# Patient Record
Sex: Male | Born: 1970 | Race: Black or African American | Hispanic: No | Marital: Single | Smoking: Never smoker
Health system: Southern US, Community
[De-identification: ages and names within clinical notes are randomized; demographics above are authoritative.]

## PROBLEM LIST (undated history)

## (undated) DIAGNOSIS — A048 Other specified bacterial intestinal infections: Secondary | ICD-10-CM

## (undated) DIAGNOSIS — K219 Gastro-esophageal reflux disease without esophagitis: Secondary | ICD-10-CM

## (undated) HISTORY — PX: NASAL SINUS SURGERY: SHX719

## (undated) HISTORY — PX: EYE SURGERY: SHX253

## (undated) HISTORY — DX: Gastro-esophageal reflux disease without esophagitis: K21.9

---

## 1999-02-22 ENCOUNTER — Emergency Department (HOSPITAL_COMMUNITY): Admission: EM | Admit: 1999-02-22 | Discharge: 1999-02-22 | Payer: Self-pay | Admitting: Emergency Medicine

## 1999-02-22 ENCOUNTER — Encounter: Payer: Self-pay | Admitting: Emergency Medicine

## 2004-03-09 ENCOUNTER — Emergency Department (HOSPITAL_COMMUNITY): Admission: EM | Admit: 2004-03-09 | Discharge: 2004-03-09 | Payer: Self-pay | Admitting: Emergency Medicine

## 2004-09-27 ENCOUNTER — Emergency Department (HOSPITAL_COMMUNITY): Admission: EM | Admit: 2004-09-27 | Discharge: 2004-09-27 | Payer: Self-pay | Admitting: Emergency Medicine

## 2005-03-18 ENCOUNTER — Emergency Department (HOSPITAL_COMMUNITY): Admission: EM | Admit: 2005-03-18 | Discharge: 2005-03-18 | Payer: Self-pay | Admitting: Emergency Medicine

## 2005-03-28 ENCOUNTER — Emergency Department (HOSPITAL_COMMUNITY): Admission: EM | Admit: 2005-03-28 | Discharge: 2005-03-28 | Payer: Self-pay | Admitting: Family Medicine

## 2005-04-14 ENCOUNTER — Emergency Department (HOSPITAL_COMMUNITY): Admission: EM | Admit: 2005-04-14 | Discharge: 2005-04-14 | Payer: Self-pay | Admitting: Emergency Medicine

## 2005-04-30 ENCOUNTER — Encounter: Admission: RE | Admit: 2005-04-30 | Discharge: 2005-04-30 | Payer: Self-pay | Admitting: Family Medicine

## 2006-12-28 ENCOUNTER — Emergency Department (HOSPITAL_COMMUNITY): Admission: EM | Admit: 2006-12-28 | Discharge: 2006-12-28 | Payer: Self-pay | Admitting: Family Medicine

## 2010-09-15 ENCOUNTER — Inpatient Hospital Stay (INDEPENDENT_AMBULATORY_CARE_PROVIDER_SITE_OTHER)
Admission: RE | Admit: 2010-09-15 | Discharge: 2010-09-15 | Disposition: A | Discharge status: Home / Self Care | Payer: BC Managed Care – PPO | Source: Ambulatory Visit | Attending: Emergency Medicine | Admitting: Emergency Medicine

## 2010-09-15 DIAGNOSIS — B081 Molluscum contagiosum: Secondary | ICD-10-CM

## 2010-09-16 ENCOUNTER — Inpatient Hospital Stay (INDEPENDENT_AMBULATORY_CARE_PROVIDER_SITE_OTHER)
Admission: RE | Admit: 2010-09-16 | Discharge: 2010-09-16 | Disposition: A | Discharge status: Home / Self Care | Payer: BC Managed Care – PPO | Source: Ambulatory Visit | Attending: Family Medicine | Admitting: Family Medicine

## 2010-09-16 DIAGNOSIS — L989 Disorder of the skin and subcutaneous tissue, unspecified: Secondary | ICD-10-CM

## 2010-09-16 LAB — RPR: RPR Ser Ql: NONREACTIVE

## 2011-01-25 ENCOUNTER — Encounter: Payer: Self-pay | Admitting: *Deleted

## 2011-01-25 ENCOUNTER — Emergency Department (INDEPENDENT_AMBULATORY_CARE_PROVIDER_SITE_OTHER)
Admission: EM | Admit: 2011-01-25 | Discharge: 2011-01-25 | Disposition: A | Discharge status: Home / Self Care | Payer: BC Managed Care – PPO | Source: Home / Self Care | Attending: Family Medicine | Admitting: Family Medicine

## 2011-01-25 DIAGNOSIS — L738 Other specified follicular disorders: Secondary | ICD-10-CM

## 2011-01-25 DIAGNOSIS — L0102 Bockhart's impetigo: Secondary | ICD-10-CM

## 2011-01-25 MED ORDER — MUPIROCIN CALCIUM 2 % EX CREA: TOPICAL_CREAM | Freq: Three times a day (TID) | CUTANEOUS | Status: AC

## 2011-01-25 NOTE — ED Notes (Signed)
Pt   Is concerned  About  A  Small  Bump  On  Penile  Shaft   He  denys  Any  Discharge  Or  Any  Burning         He  Appears  In no  Distress

## 2011-01-25 NOTE — ED Provider Notes (Signed)
History     CSN: [id number]  Arrival date & time 01/25/11  1030   First MD Initiated Contact with Patient 01/25/11 1134      Chief Complaint  Patient presents with  . Sore    (Consider location/radiation/quality/duration/timing/severity/associated sxs/prior treatment) Patient is a 41 y.o. male presenting with rash. The history is provided by the patient.  Rash  This is a new problem. The current episode started 3 to 5 hours ago (noticed small papules on penis, concerned). The problem has not changed since onset.There has been no fever. The rash is present on the genitalia and groin. The patient is experiencing no pain. Pertinent negatives include no blisters and no itching.    History reviewed. No pertinent past medical history.  History reviewed. No pertinent past surgical history.  History reviewed. No pertinent family history.  History  Substance Use Topics  . Smoking status: Not on file  . Smokeless tobacco: Not on file  . Alcohol Use: Not on file      Review of Systems  Genitourinary: Negative.   Skin: Positive for rash. Negative for itching.    Allergies  Review of patient's allergies indicates no known allergies.  Home Medications   Current Outpatient Rx  Name Route Sig Dispense Refill  . MUPIROCIN CALCIUM 2 % EX CREA Topical Apply topically 3 (three) times daily. 15 g 0    BP 155/94  Pulse 62  Temp(Src) 98.3 F (36.8 C) (Oral)  Resp 16  SpO2 100%  Physical Exam  Nursing note and vitals reviewed. Constitutional: He appears well-developed and well-nourished.  Genitourinary:    No penile tenderness.    ED Course  Procedures (including critical care time)  Labs Reviewed - No data to display No results found.   1. Pustular folliculitis       MDM          Barkley Bruns, MD 01/29/11 [phone]

## 2011-02-08 ENCOUNTER — Other Ambulatory Visit: Payer: Self-pay | Admitting: Family Medicine

## 2011-02-08 ENCOUNTER — Ambulatory Visit
Admission: RE | Admit: 2011-02-08 | Discharge: 2011-02-08 | Disposition: A | Discharge status: Home / Self Care | Payer: BC Managed Care – PPO | Source: Ambulatory Visit | Attending: Family Medicine | Admitting: Family Medicine

## 2011-02-08 DIAGNOSIS — R52 Pain, unspecified: Secondary | ICD-10-CM

## 2012-10-09 ENCOUNTER — Other Ambulatory Visit: Payer: Self-pay | Admitting: Family Medicine

## 2012-10-09 DIAGNOSIS — G8929 Other chronic pain: Secondary | ICD-10-CM

## 2012-10-12 ENCOUNTER — Ambulatory Visit: Payer: BC Managed Care – PPO | Attending: Family Medicine

## 2013-02-14 ENCOUNTER — Emergency Department (HOSPITAL_COMMUNITY)
Admission: EM | Admit: 2013-02-14 | Discharge: 2013-02-14 | Disposition: A | Discharge status: Home / Self Care | Payer: BC Managed Care – PPO | Attending: Emergency Medicine | Admitting: Emergency Medicine

## 2013-02-14 ENCOUNTER — Encounter (HOSPITAL_COMMUNITY): Payer: Self-pay | Admitting: Emergency Medicine

## 2013-02-14 DIAGNOSIS — J329 Chronic sinusitis, unspecified: Secondary | ICD-10-CM | POA: Insufficient documentation

## 2013-02-14 DIAGNOSIS — R110 Nausea: Secondary | ICD-10-CM | POA: Insufficient documentation

## 2013-02-14 MED ORDER — PSEUDOEPHEDRINE HCL 30 MG PO TABS: 30.0000 mg | ORAL_TABLET | ORAL | Status: DC | PRN

## 2013-02-14 MED ORDER — AMOXICILLIN-POT CLAVULANATE 875-125 MG PO TABS: 1.0000 | ORAL_TABLET | Freq: Two times a day (BID) | ORAL | Status: DC

## 2013-02-14 NOTE — ED Notes (Signed)
Patient states his symptoms have been going on since December. He has been taking zyrtec and saline spray for his head congestion. Drainage from nose is clear but he states he has been coughing up green sputum.

## 2013-02-14 NOTE — ED Notes (Signed)
Presents with pain behind eyes began during the summer time, told he had a sinus infection and took amoxicillin with no relief. Since has been experiencing pressure and pain around eye sockets and nose. Also reports dizziness with movement, dizziness is better when still. When my nose in not draining, it hurts and there is pressure. When it is draining I am good and I feel great, when I lean forward I feel dizzy.

## 2013-02-14 NOTE — ED Provider Notes (Signed)
CSN: [id number]     Arrival date & time 02/14/13  1450 History  This chart was scribed for non-physician practitioner Ignacia Felling, PA-C, working with Threasa Beards, MD, by Neta Ehlers, ED Scribe. This patient was seen in room TR09C/TR09C and the patient's care was started at 3:19 PM.   First MD Initiated Contact with Patient 02/14/13 1519     Chief Complaint  Patient presents with  . Eye Pain   Patient is a 43 y.o. male presenting with eye pain. The history is provided by the patient. No language interpreter was used.  Eye Pain    HPI Comments: Richardson Dubree is a 43 y.o. male who presents to the Emergency Department complaining of bilateral eye pain which has persisted for approximately a month. The pt reports he has also experienced facial pressure, rhinorrhea, a cough productive of green phlegm, an intermittent sore throat, and nausea when leaning forward. The pt reports the cough occurs primarily in the morning. He denies chills and a fever. Previously, he was treated for similar symptoms with amoxicillin without relief. The pt is a non-smoker.   History reviewed. No pertinent past medical history. History reviewed. No pertinent past surgical history. History reviewed. No pertinent family history. History  Substance Use Topics  . Smoking status: Never Smoker   . Smokeless tobacco: Not on file  . Alcohol Use: No    Review of Systems  Constitutional: Negative for fever and chills.  HENT: Positive for rhinorrhea, sinus pressure and sore throat.   Eyes: Positive for pain.  Respiratory: Positive for cough.   Gastrointestinal: Positive for nausea (Positional).  All other systems reviewed and are negative.   Allergies  Review of patient's allergies indicates no known allergies.  Home Medications  No current outpatient prescriptions on file.  Triage Vitals: BP 150/85  Pulse 71  Temp(Src) 97.9 F (36.6 C) (Oral)  Resp 18  Ht 5\' 10"  (1.778 m)  Wt 193 lb (87.544 kg)   BMI 27.69 kg/m2  SpO2 99%  Physical Exam  Nursing note and vitals reviewed. Constitutional: He is oriented to person, place, and time. He appears well-developed and well-nourished. No distress.  HENT:  Head: Normocephalic and atraumatic.  Left Ear: External ear normal.  Mouth/Throat: Oropharynx is clear and moist. No oropharyngeal exudate.  Boggy nasal mucosa, frontal and maxillary sinus ttp   Eyes: Conjunctivae and EOM are normal. Pupils are equal, round, and reactive to light. No scleral icterus.  Neck: Normal range of motion. Neck supple. No tracheal deviation present.  Cardiovascular: Normal rate, regular rhythm and normal heart sounds.  Exam reveals no gallop and no friction rub.   No murmur heard. Pulmonary/Chest: Effort normal. No respiratory distress. He has no wheezes. He has no rales. He exhibits no tenderness.  Abdominal: Soft. Bowel sounds are normal. He exhibits no distension. There is no tenderness.  Musculoskeletal: Normal range of motion.  Lymphadenopathy:    He has no cervical adenopathy.  Neurological: He is alert and oriented to person, place, and time. He exhibits normal muscle tone. Coordination normal.  Skin: Skin is warm and dry.  Psychiatric: He has a normal mood and affect. His behavior is normal.    ED Course  Procedures (including critical care time)  DIAGNOSTIC STUDIES: Oxygen Saturation is 99% on room air, normal by my interpretation.    COORDINATION OF CARE:  3:25 PM- Discussed treatment plan with patient, which includes the antibiotic Augmentin and a nasal decongestion, and the patient agreed to the plan.  Also encouraged the pt to try a Nettie pot.   Labs Review Labs Reviewed - No data to display Imaging Review No results found.  EKG Interpretation   None       MDM  Sinus infection  Patient here with complaints of headache, sinus pain and pressure since the summer.  There is no fever, chills, reports occasional nausea but no vomiting.   Will place on antibiotics and nasal decongestants.  I personally performed the services described in this documentation, which was scribed in my presence. The recorded information has been reviewed and is accurate.      Idalia Needle Joelyn Oms, [place] 02/14/13 1975

## 2013-02-14 NOTE — Discharge Instructions (Signed)

## 2013-02-14 NOTE — ED Provider Notes (Signed)
Medical screening examination/treatment/procedure(s) were performed by non-physician practitioner and as supervising physician I was immediately available for consultation/collaboration.  EKG Interpretation   None        Threasa Beards, MD 02/14/13 [phone]

## 2013-03-11 ENCOUNTER — Emergency Department (HOSPITAL_COMMUNITY): Payer: BC Managed Care – PPO

## 2013-03-11 ENCOUNTER — Encounter (HOSPITAL_COMMUNITY): Payer: Self-pay | Admitting: Emergency Medicine

## 2013-03-11 ENCOUNTER — Emergency Department (HOSPITAL_COMMUNITY)
Admission: EM | Admit: 2013-03-11 | Discharge: 2013-03-11 | Disposition: A | Discharge status: Home / Self Care | Payer: BC Managed Care – PPO | Attending: Emergency Medicine | Admitting: Emergency Medicine

## 2013-03-11 DIAGNOSIS — R05 Cough: Secondary | ICD-10-CM

## 2013-03-11 DIAGNOSIS — J3489 Other specified disorders of nose and nasal sinuses: Secondary | ICD-10-CM | POA: Insufficient documentation

## 2013-03-11 DIAGNOSIS — R059 Cough, unspecified: Secondary | ICD-10-CM | POA: Insufficient documentation

## 2013-03-11 DIAGNOSIS — R6883 Chills (without fever): Secondary | ICD-10-CM | POA: Insufficient documentation

## 2013-03-11 MED ORDER — GUAIFENESIN 100 MG/5ML PO LIQD: 100.0000 mg | ORAL | Status: DC | PRN

## 2013-03-11 NOTE — ED Notes (Addendum)
Pt c/o coughing up blood 2 weeks ago after having a root canal and dx with sinus infection.  Pt c/o pressure type pain in face.

## 2013-03-11 NOTE — Discharge Instructions (Signed)

## 2013-03-11 NOTE — ED Provider Notes (Signed)
CSN: [id number]     Arrival date & time 03/11/13  1711 History  This chart was scribed for non-physician practitioner Montine Circle, PA-C working with Saddie Benders. Dorna Mai, MD by Eston Mould, ED Scribe. This patient was seen in room TR06C/TR06C and the patient's care was started at 5:34 PM .   Chief Complaint  Patient presents with  . Hemoptysis   The history is provided by the patient. No language interpreter was used.   HPI Comments: Preston Manning is a 43 y.o. male who presents to the Emergency Department complaining of hemoptysis with associated chills and night sweats that began recently . Pt states he was diagnosed with chronic sinusitis by [facility]. He reports having surgery in 2 weeks for sinuses. Pt states he recently had a root canal to L upper 1st molar. Additionally, he states he has recently started coughing dark brown to red sputum. Pt states he feels SOB when working out but states this is unchanged from normal. Pt denies fever, CP, and SOB.  History reviewed. No pertinent past medical history. History reviewed. No pertinent past surgical history. No family history on file. History  Substance Use Topics  . Smoking status: Never Smoker   . Smokeless tobacco: Not on file  . Alcohol Use: No    Review of Systems  Constitutional: Positive for chills (& night sweats). Negative for fever.  HENT: Positive for congestion.   Respiratory: Positive for cough (with sputum). Negative for chest tightness and shortness of breath.   Cardiovascular: Negative for chest pain.    Allergies  Review of patient's allergies indicates no known allergies.  Home Medications   Current Outpatient Rx  Name  Route  Sig  Dispense  Refill  . amoxicillin-clavulanate (AUGMENTIN) 875-125 MG per tablet   Oral   Take 1 tablet by mouth 2 (two) times daily.   28 tablet   0   . Ascorbic Acid (VITAMIN C PO)   Oral   Take 1 tablet by mouth daily.         Marland Kitchen glucosamine-chondroitin 500-400 MG  tablet   Oral   Take 1 tablet by mouth 3 (three) times daily.         . pseudoephedrine (SUDAFED) 30 MG tablet   Oral   Take 1 tablet (30 mg total) by mouth every 4 (four) hours as needed for congestion.   30 tablet   0    BP 163/95  Pulse 76  Temp(Src) 97.4 F (36.3 C) (Oral)  Resp 16  SpO2 100%  Physical Exam  Nursing note and vitals reviewed. Constitutional: He is oriented to person, place, and time. He appears well-developed and well-nourished. No distress.  HENT:  Head: Normocephalic and atraumatic.  Oropharynx clear, no evidence of tonsillar, peritonsillar or gingival abscess, uvula is midline. Airway is intact.  Frontal and maxillary sinuses are tender to palpitation.  No evidence of preseptal or perioribtal cellulitis.  Eyes: EOM are normal.  Neck: Neck supple. No tracheal deviation present.  Cardiovascular: Normal rate, regular rhythm and normal heart sounds.  Exam reveals no gallop and no friction rub.   No murmur heard. Pulmonary/Chest: Effort normal and breath sounds normal. No respiratory distress. He has no wheezes. He has no rales. He exhibits no tenderness.  Musculoskeletal: Normal range of motion.  Lymphadenopathy:    He has no cervical adenopathy.  Neurological: He is alert and oriented to person, place, and time.  Skin: Skin is warm and dry.  Psychiatric: He has a normal mood  and affect. His behavior is normal.    ED Course  Procedures DIAGNOSTIC STUDIES: Oxygen Saturation is 100% on RA, normal by my interpretation.    COORDINATION OF CARE: 5:38 PM-Discussed treatment plan which includes CXR to rule out pneumonia. Pt agreed to plan.   Labs Review Labs Reviewed - No data to display Imaging Review Dg Chest 2 View  03/11/2013   CLINICAL DATA:  Hemoptysis, chest pain.  EXAM: CHEST  2 VIEW  COMPARISON:  February 08, 2011.  FINDINGS: The heart size and mediastinal contours are within normal limits. Both lungs are clear. No pleural effusion or  pneumothorax is noted. The visualized skeletal structures are unremarkable.  IMPRESSION: No acute cardiopulmonary abnormality seen.   Electronically Signed   By: Sabino Dick M.D.   On: 03/11/2013 18:14    EKG Interpretation   None      MDM   Final diagnoses:  Cough    Patient's main complaint is productive cough. He states this started recently. He denies any chest pain, or associated shortness of breath. Doubt PE, patient is not tachycardic, nor does he have any desaturation in his oxygenation. I will check a chest x-ray, which if negative, I will ambulate the patient.  As long as he maintains sufficient oxygenation, believe that he can be discharged home. Strict return precautions are given.  Chest x-ray is negative. Discharged to home with symptomatic treatment. Patient has good followup. He has not hypoxic nor tachycardic, nor does he have any chest pain.  Is as discharging the patient, he mentioned that his upper traps have been stiff, and he thinks that it is giving of headache. I suspect this likely tension headache. Has no meningeal signs, no neck stiffness, no fever. He looks well. 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 I suspect this is muscle tension, and will recommend ibuprofen and ice.  Discussed patient with Dr. Dorna Mai, who agrees with plan.  I personally performed the services described in this documentation, which was scribed in my presence. The recorded information has been reviewed and is accurate.     Montine Circle, PA-C 03/11/13 [phone]

## 2013-03-19 NOTE — ED Provider Notes (Signed)
Medical screening examination/treatment/procedure(s) were performed by non-physician practitioner and as supervising physician I was immediately available for consultation/collaboration.  EKG Interpretation  None  .  Saddie Benders. Miasia Crabtree, MD 03/19/13 1032

## 2013-03-24 ENCOUNTER — Other Ambulatory Visit: Payer: Self-pay | Admitting: Otolaryngology

## 2013-05-29 ENCOUNTER — Encounter (HOSPITAL_COMMUNITY): Payer: Self-pay | Admitting: Emergency Medicine

## 2013-05-29 ENCOUNTER — Emergency Department (HOSPITAL_COMMUNITY)
Admission: EM | Admit: 2013-05-29 | Discharge: 2013-05-29 | Disposition: A | Discharge status: Home / Self Care | Payer: BC Managed Care – PPO | Source: Home / Self Care

## 2013-05-29 DIAGNOSIS — K219 Gastro-esophageal reflux disease without esophagitis: Secondary | ICD-10-CM

## 2013-05-29 MED ORDER — OMEPRAZOLE 20 MG PO CPDR: 20.0000 mg | DELAYED_RELEASE_CAPSULE | Freq: Two times a day (BID) | ORAL | Status: DC

## 2013-05-29 NOTE — ED Provider Notes (Signed)
  Chief Complaint   Chief Complaint  Patient presents with  . Gastrophageal Reflux    History of Present Illness   Preston Manning is a 43 year old male who went on a cruise a couple weeks ago and over it. He gained about 10 pounds then lost 6, so is about 4 pounds up. Ever since then he's had increase in symptoms of reflux with indigestion, heartburn, abdomen feels bloated, and he has a sensation of a lump in the throat, intermittent hoarseness. He denies any sore throat or dysphagia. He's had no nausea, vomiting, or evidence of GI bleeding. He has had gastroesophageal reflux in the past but none recently. He's also had mildly elevated liver function tests and he was positive for H. pylori in 2006. He took a course of antibiotics for this. He has no history of ulcers or GI bleeding.  Review of Systems   Other than as noted above, the patient denies any of the following symptoms: Constitutional:  No fever, chills, weight loss or anorexia. Abdomen:  No nausea, vomiting, hematememesis, melena, diarrhea, or hematochezia. GU:  No dysuria, frequency, urgency, or hematuria.  No testicular pain or swelling.  [place]   Past medical history, family history, social history, meds, and allergies were reviewed.   Physical Examination     Vital signs:  BP 138/90  Pulse 65  Temp(Src) 98.5 F (36.9 C) (Oral)  Resp 18  SpO2 100% Gen:  Alert, oriented, in no distress. Lungs:  Breath sounds clear and equal bilaterally.  No wheezes, rales or rhonchi. Heart:  Regular rhythm.  No gallops or murmers.   Abdomen:  Mild epigastric tenderness to palpation. No guarding or rebound. No organomegaly or mass. Bowel sounds are normally active. Skin:  Clear, warm and dry.  No rash.  Assessment   The encounter diagnosis was GERD (gastroesophageal reflux disease).  Plan     1.  Meds:  The following meds were prescribed:   Discharge Medication List as of 05/29/2013  1:52 PM    START taking these medications   Details  omeprazole (PRILOSEC) 20 MG capsule Take 1 capsule (20 mg total) by mouth 2 (two) times daily before a meal., Starting 05/29/2013, Until Discontinued, Normal        2.  Patient Education/Counseling:  The patient was given appropriate handouts, self care instructions, and instructed in symptomatic relief.  Patient was placed on antireflux diet. If no improvement will need to followup with gastroenterology.  3.  Follow up:  The patient was told to follow up here if no better in 2 to 3 days, or sooner if becoming worse in any way, and given some red flag symptoms such as worsening pain, persistent vomiting, fever, or evidence of GI bleeding which would prompt immediate return.  Follow up here if needed.      Harden Mo, MD 05/29/13 [phone]

## 2013-05-29 NOTE — Discharge Instructions (Signed)

## 2013-05-29 NOTE — ED Notes (Signed)
Pt  Reports  Symptoms  Of  Bloating   And  Abdominal  Pressure         With    Reflux    Symptoms  For  sev  Days   No  Nausea  No  Vomiting    Symptoms  Not  releived    By  meds

## 2013-06-01 ENCOUNTER — Encounter: Payer: Self-pay | Admitting: Internal Medicine

## 2013-06-09 ENCOUNTER — Other Ambulatory Visit: Payer: Self-pay | Admitting: Family Medicine

## 2013-06-09 DIAGNOSIS — R7989 Other specified abnormal findings of blood chemistry: Secondary | ICD-10-CM

## 2013-06-09 DIAGNOSIS — R945 Abnormal results of liver function studies: Principal | ICD-10-CM

## 2013-06-11 ENCOUNTER — Ambulatory Visit
Admission: RE | Admit: 2013-06-11 | Discharge: 2013-06-11 | Disposition: A | Discharge status: Home / Self Care | Payer: BC Managed Care – PPO | Source: Ambulatory Visit | Attending: Family Medicine | Admitting: Family Medicine

## 2013-06-11 DIAGNOSIS — R7989 Other specified abnormal findings of blood chemistry: Secondary | ICD-10-CM

## 2013-06-11 DIAGNOSIS — R945 Abnormal results of liver function studies: Principal | ICD-10-CM

## 2013-07-22 ENCOUNTER — Encounter (HOSPITAL_COMMUNITY): Payer: Self-pay | Admitting: Emergency Medicine

## 2013-07-22 ENCOUNTER — Encounter: Payer: Self-pay | Admitting: Internal Medicine

## 2013-07-22 ENCOUNTER — Emergency Department (INDEPENDENT_AMBULATORY_CARE_PROVIDER_SITE_OTHER)
Admission: EM | Admit: 2013-07-22 | Discharge: 2013-07-22 | Disposition: A | Discharge status: Home / Self Care | Payer: BC Managed Care – PPO | Source: Home / Self Care

## 2013-07-22 DIAGNOSIS — K148 Other diseases of tongue: Secondary | ICD-10-CM

## 2013-07-22 HISTORY — DX: Other specified bacterial intestinal infections: A04.8

## 2013-07-22 MED ORDER — NYSTATIN 100000 UNIT/ML MT SUSP: 500000.0000 [IU] | Freq: Four times a day (QID) | OROMUCOSAL | Status: DC

## 2013-07-22 NOTE — Discharge Instructions (Signed)
Thank you for coming in today. Thrush, Adult  Ritta Slot, also called oral candidiasis, is a fungal infection that develops in the mouth and throat and on the tongue. It causes white patches to form on the mouth and tongue. Ritta Slot is most common in older adults, but it can occur at any age.  Many cases of thrush are mild, but this infection can also be more serious. Ritta Slot can be a recurring problem for people who have chronic illnesses or who take medicines that limit the body's ability to fight infection. Because these people have difficulty fighting infections, the fungus that causes thrush can spread throughout the body. This can cause life-threatening blood or organ infections. CAUSES  Ritta Slot is usually caused by a yeast called Candida albicans. This fungus is normally present in small amounts in the mouth and on other mucous membranes. It usually causes no harm. However, when conditions are present that allow the fungus to grow uncontrolled, it invades surrounding tissues and becomes an infection. Less often, other Candida species can also lead to thrush.  RISK FACTORS Ritta Slot is more likely to develop in the following people:  People with an impaired ability to fight infection (weakened immune system).   Older adults.   People with HIV.   People with diabetes.   People with dry mouth (xerostomia).   Pregnant women.   People with poor dental care, especially those who have false teeth.   People who use antibiotic medicines.  SIGNS AND SYMPTOMS  Ritta Slot can be a mild infection that causes no symptoms. If symptoms develop, they may include:   A burning feeling in the mouth and throat. This can occur at the start of a thrush infection.   White patches that adhere to the mouth and tongue. The tissue around the patches may be red, raw, and painful. If rubbed (during tooth brushing, for example), the patches and the tissue of the mouth may bleed easily.   A bad taste in the mouth  or difficulty tasting foods.   Cottony feeling in the mouth.   Pain during eating and swallowing. DIAGNOSIS  Your health care provider can usually diagnose thrush by looking in your mouth and asking you questions about your health.  TREATMENT  Medicines that help prevent the growth of fungi (antifungals) are the standard treatment for thrush. These medicines are either applied directly to the affected area (topical) or swallowed (oral). The treatment will depend on the severity of the condition.  Mild Thrush Mild cases of thrush may clear up with the use of an antifungal mouth rinse or lozenges. Treatment usually lasts about 14 days.  Moderate to Severe Thrush  More severe thrush infections that have spread to the esophagus are treated with an oral antifungal medicine. A topical antifungal medicine may also be used.   For some severe infections, a treatment period longer than 14 days may be needed.   Oral antifungal medicines are almost never used during pregnancy because the fetus may be harmed. However, if a pregnant woman has a rare, severe thrush infection that has spread to her blood, oral antifungal medicines may be used. In this case, the risk of harm to the mother and fetus from the severe thrush infection may be greater than the risk posed by the use of antifungal medicines.  Persistent or Recurrent Thrush For cases of thrush that do not go away or keep coming back, treatment may involve the following:   Treatment may be needed twice as long as the  symptoms last.   Treatment will include both oral and topical antifungal medicines.   People with weakened immune systems can take an antifungal medicine on a continuous basis to prevent thrush infections.  It is important to treat conditions that make you more likely to get thrush, such as diabetes or HIV.  HOME CARE INSTRUCTIONS   Only take over-the-counter or prescription medicine as directed by your health care provider.  Talk to your health care provider about an over-the-counter medicine called gentian violet, which kills bacteria and fungi.   Eat plain, unflavored yogurt as directed by your health care provider. Check the label to make sure the yogurt contains live cultures. This yogurt can help healthy bacteria grow in the mouth that can stop the growth of the fungus that causes thrush.   Try these measures to help reduce the discomfort of thrush:   Drink cold liquids such as water or iced tea.   Try flavored ice treats or frozen juices.   Eat foods that are easy to swallow, such as gelatin, ice cream, or custard.   If the patches in your mouth are painful, try drinking from a straw.   Rinse your mouth several times a day with a warm saltwater rinse. You can make the saltwater mixture with 1 tsp (6 g) of salt in 8 fl oz (0.2 L) of warm water.   If you wear dentures, remove the dentures before going to bed, brush them vigorously, and soak them in a cleaning solution as directed by your health care provider.   Women who are breastfeeding should clean their nipples with an antifungal medicine as directed by their health care provider. Dry the nipples after breastfeeding. Applying lanolin-containing body lotion may help relieve nipple soreness.  SEEK MEDICAL CARE IF:  Your symptoms are getting worse or are not improving within 7 days of starting treatment.   You have symptoms of spreading infection, such as white patches on the skin outside of the mouth.   You are nursing and you have redness, burning, or pain in the nipples that is not relieved with treatment.  MAKE SURE YOU:  Understand these instructions.  Will watch your condition.  Will get help right away if you are not doing well or get worse. Document Released: 10/03/2003 Document Revised: 10/28/2012 Document Reviewed: 08/10/2012 [facility] Patient Information 2015 [place], [place]. This information is not intended to replace advice  given to you by your health care provider. Make sure you discuss any questions you have with your health care provider.

## 2013-07-22 NOTE — ED Notes (Signed)
C/o white tongue.  Noticed may 19.  Patient has been seen and treated for hpylori, reports last dose taken 2 weeks ago

## 2013-07-22 NOTE — ED Provider Notes (Signed)
Astor Gentle is a 43 y.o. male who presents to Urgent Care today for discolored tongue. Patient is white discoloration to his tongue. This occurred for several weeks. He noticed that after treatment for H. pylori by his primary care Dr. he denies any significant irritation fevers or chills nausea vomiting or diarrhea. No trouble swallowing or speaking.   Past Medical History  Diagnosis Date  . Helicobacter pylori (H. pylori)    History  Substance Use Topics  . Smoking status: Never Smoker   . Smokeless tobacco: Not on file  . Alcohol Use: No   ROS as above Medications: No current facility-administered medications for this encounter.   Current Outpatient Prescriptions  Medication Sig Dispense Refill  . Ascorbic Acid (VITAMIN C PO) Take 1 tablet by mouth 3 (three) times daily.       Marland Kitchen GARLIC PO Take 1 capsule by mouth 3 (three) times daily.      Marland Kitchen glucosamine-chondroitin 500-400 MG tablet Take 1 tablet by mouth 2 (two) times daily.       Marland Kitchen ibuprofen (ADVIL,MOTRIN) 200 MG tablet Take 400 mg by mouth 3 (three) times daily as needed (pain).      . nystatin (MYCOSTATIN) 100000 UNIT/ML suspension Take 5 mLs (500,000 Units total) by mouth 4 (four) times daily. Swish and swallow x 1 week  240 mL  1  . omeprazole (PRILOSEC) 20 MG capsule Take 1 capsule (20 mg total) by mouth 2 (two) times daily before a meal.  60 capsule  1  . OREGANO PO Take 1 capsule by mouth daily.      . pseudoephedrine (SUDAFED) 30 MG tablet Take 30 mg by mouth daily as needed for congestion.      . TURMERIC PO Take 1 capsule by mouth daily.        Exam:  BP 138/85  Pulse 69  Temp(Src) 98 F (36.7 C) (Oral)  Resp 16  SpO2 100% Gen: Well NAD HEENT: EOMI,  MMM mild whitish discoloration of the tongue. No other oral lesions. Lungs: Normal work of breathing. CTABL Heart: RRR no MRG Abd: NABS, Soft. NT, ND Exts: Brisk capillary refill, warm and well perfused.   No results found for this or any previous visit (from  the past 24 hour(s)). No results found.  Assessment and Plan: 43 y.o. male with possible oral thrush. Plan to treat with nystatin  Discussed warning signs or symptoms. Please see discharge instructions. Patient expresses understanding.    Gregor Hams, MD 07/22/13 1426

## 2013-07-29 ENCOUNTER — Ambulatory Visit (INDEPENDENT_AMBULATORY_CARE_PROVIDER_SITE_OTHER): Payer: BC Managed Care – PPO | Admitting: Internal Medicine

## 2013-07-29 ENCOUNTER — Encounter: Payer: Self-pay | Admitting: Internal Medicine

## 2013-07-29 ENCOUNTER — Other Ambulatory Visit (INDEPENDENT_AMBULATORY_CARE_PROVIDER_SITE_OTHER): Payer: BC Managed Care – PPO

## 2013-07-29 VITALS — BP 138/80 | HR 64 | Ht 70.0 in | Wt 192.0 lb

## 2013-07-29 DIAGNOSIS — Z8619 Personal history of other infectious and parasitic diseases: Secondary | ICD-10-CM

## 2013-07-29 DIAGNOSIS — R140 Abdominal distension (gaseous): Secondary | ICD-10-CM

## 2013-07-29 DIAGNOSIS — R1010 Upper abdominal pain, unspecified: Secondary | ICD-10-CM

## 2013-07-29 DIAGNOSIS — R1013 Epigastric pain: Secondary | ICD-10-CM

## 2013-07-29 DIAGNOSIS — R143 Flatulence: Secondary | ICD-10-CM

## 2013-07-29 DIAGNOSIS — K219 Gastro-esophageal reflux disease without esophagitis: Secondary | ICD-10-CM

## 2013-07-29 DIAGNOSIS — R142 Eructation: Secondary | ICD-10-CM

## 2013-07-29 DIAGNOSIS — Z800 Family history of malignant neoplasm of digestive organs: Secondary | ICD-10-CM

## 2013-07-29 DIAGNOSIS — K3189 Other diseases of stomach and duodenum: Secondary | ICD-10-CM

## 2013-07-29 DIAGNOSIS — R109 Unspecified abdominal pain: Secondary | ICD-10-CM

## 2013-07-29 DIAGNOSIS — R141 Gas pain: Secondary | ICD-10-CM

## 2013-07-29 LAB — CBC
HCT: 42.1 % (ref 39.0–52.0)
Hemoglobin: 13.8 g/dL (ref 13.0–17.0)
MCHC: 32.8 g/dL (ref 30.0–36.0)
MCV: 76.3 fl — AB (ref 78.0–100.0)
Platelets: 195 10*3/uL (ref 150.0–400.0)
RBC: 5.52 Mil/uL (ref 4.22–5.81)
RDW: 15 % (ref 11.5–15.5)
WBC: 3.7 10*3/uL — ABNORMAL LOW (ref 4.0–10.5)

## 2013-07-29 LAB — COMPREHENSIVE METABOLIC PANEL
ALBUMIN: 4 g/dL (ref 3.5–5.2)
ALK PHOS: 67 U/L (ref 39–117)
ALT: 69 U/L — ABNORMAL HIGH (ref 0–53)
AST: 71 U/L — AB (ref 0–37)
BILIRUBIN TOTAL: 0.6 mg/dL (ref 0.2–1.2)
BUN: 11 mg/dL (ref 6–23)
CO2: 29 mEq/L (ref 19–32)
Calcium: 9.6 mg/dL (ref 8.4–10.5)
Chloride: 101 mEq/L (ref 96–112)
Creatinine, Ser: 0.9 mg/dL (ref 0.4–1.5)
GFR: 123.52 mL/min (ref >60.00)
Glucose, Bld: 105 mg/dL — ABNORMAL HIGH (ref 70–99)
POTASSIUM: 4.1 meq/L (ref 3.5–5.1)
SODIUM: 135 meq/L (ref 135–145)
TOTAL PROTEIN: 7.4 g/dL (ref 6.0–8.3)

## 2013-07-29 MED ORDER — PANTOPRAZOLE SODIUM 40 MG PO TBEC: 40.0000 mg | DELAYED_RELEASE_TABLET | Freq: Every day | ORAL | Status: DC

## 2013-07-29 MED ORDER — RESTORA PO CAPS: 1.0000 | ORAL_CAPSULE | Freq: Every day | ORAL | Status: AC

## 2013-07-29 MED ORDER — MOVIPREP 100 G PO SOLR: ORAL | Status: DC

## 2013-07-29 MED ORDER — RANITIDINE HCL 150 MG PO TABS: 150.0000 mg | ORAL_TABLET | Freq: Two times a day (BID) | ORAL | Status: DC

## 2013-07-29 NOTE — Progress Notes (Signed)
Patient ID: Preston Manning, male   DOB: May 24, 1970, 43 y.o.   MRN: [id number] HPI: Preston Manning is a 43 yo male with PMH of GERD, H. pylori, family history of colon cancer who is seen in to evaluate heartburn, epigastric discomfort and bloating. He reports he has a history of H. pylori which was treated initially with Prevpac 7 years ago. He's been having epigastric discomfort along with abdominal bloating and he reports recently having an H. pylori stool antigen again which was positive. He was treated again with Prevpac x10 days which he completed 3 weeks ago. Symptoms seem to worsen during antibiotic treatment and he also noted change in his bowel movements with antibiotics. He has heartburn worse after exercising and he is taking Zantac to 3 times daily. He did try omeprazole 20 mg daily without much relief. He denies dysphagia or odynophagia. He does report abdominal bloating which is worse after eating. He also endorses globus sensation. Bowel movements vary between loose and constipation, but he has made dietary change which has seemed to help. He is now eating fish and chicken primarily and avoiding red meats and fatty foods. He exercises frequently. He denies blood in his stool or melena. He has a family history notable for colon cancer in his maternal in an uncle, both occurring before age 13. He also reports recent abdominal ultrasound which was performed for elevation liver enzymes. His liver tests and all lab tests are unavailable through [facility]  Past Medical History  Diagnosis Date  . Helicobacter pylori (H. pylori)   . GERD (gastroesophageal reflux disease)     Past Surgical History  Procedure Laterality Date  . Nasal sinus surgery      Outpatient Prescriptions Prior to Visit  Medication Sig Dispense Refill  . Ascorbic Acid (VITAMIN C PO) Take 1 tablet by mouth 3 (three) times daily.       Marland Kitchen GARLIC PO Take 1 capsule by mouth 3 (three) times daily.      Marland Kitchen glucosamine-chondroitin 500-400 MG  tablet Take 1 tablet by mouth 2 (two) times daily.       Marland Kitchen ibuprofen (ADVIL,MOTRIN) 200 MG tablet Take 400 mg by mouth 3 (three) times daily as needed (pain).      . nystatin (MYCOSTATIN) 100000 UNIT/ML suspension Take 5 mLs (500,000 Units total) by mouth 4 (four) times daily. Swish and swallow x 1 week  240 mL  1  . omeprazole (PRILOSEC) 20 MG capsule Take 1 capsule (20 mg total) by mouth 2 (two) times daily before a meal.  60 capsule  1  . OREGANO PO Take 1 capsule by mouth daily.      . pseudoephedrine (SUDAFED) 30 MG tablet Take 30 mg by mouth daily as needed for congestion.      . TURMERIC PO Take 1 capsule by mouth daily.       No facility-administered medications prior to visit.    No Known Allergies  Family History  Problem Relation Age of Onset  . Colon cancer Maternal Uncle   . Diabetes      Maternal family    History  Substance Use Topics  . Smoking status: Never Smoker   . Smokeless tobacco: Never Used  . Alcohol Use: No    ROS: As per history of present illness, otherwise negative  BP 138/80  Pulse 64  Ht 5\' 10"  (1.778 m)  Wt 192 lb (87.091 kg)  BMI 27.55 kg/m2 Constitutional: Well-developed and well-nourished. No distress. HEENT: Normocephalic and atraumatic.  Oropharynx is clear and moist. No oropharyngeal exudate. Conjunctivae are normal.  No scleral icterus. Neck: Neck supple. Trachea midline. Cardiovascular: Normal rate, regular rhythm and intact distal pulses. No M/R/G Pulmonary/chest: Effort normal and breath sounds normal. No wheezing, rales or rhonchi. Abdominal: Soft, nontender, nondistended. Bowel sounds active throughout. There are no masses palpable. No hepatosplenomegaly. Extremities: no clubbing, cyanosis, or edema Lymphadenopathy: No cervical adenopathy noted. Neurological: Alert and oriented to person place and time. Skin: Skin is warm and dry. No rashes noted. Psychiatric: Normal mood and affect. Behavior is normal.  RELEVANT LABS AND  IMAGING: EXAM: ULTRASOUND ABDOMEN COMPLETE   COMPARISON:  None.   FINDINGS: Gallbladder:   No gallstones or wall thickening visualized. No sonographic Murphy sign noted.   Common bile duct:   Diameter: 4.8 mm   Liver:   No focal lesion identified. Within normal limits in parenchymal echogenicity.   IVC:   No abnormality visualized.   Pancreas:   Visualized portion unremarkable.   Spleen:   Size and appearance within normal limits.   Right Kidney:   Length: 12.7 cm. Echogenicity within normal limits. No mass or hydronephrosis visualized.   Left Kidney:   Length: 13.8 cm. Echogenicity within normal limits. No mass or hydronephrosis visualized.   Abdominal aorta:   No aneurysm visualized.   Other findings:   None.   IMPRESSION: Normal abdominal ultrasound     Electronically Signed   By: Lajean Manes M.D.   On: 06/11/2013 14:51  ASSESSMENT/PLAN: 43 yo male with PMH of GERD, H. pylori, family history of colon cancer who is seen in to evaluate heartburn, epigastric discomfort and bloating.   1. GERD with dyspepsia, history of H. Pylori -- given that he has been treated twice with Prevpac it is possible that he has an organism resistant to macrolide or penicillin antibiotic. He is also having ongoing symptoms of dyspepsia and I have recommended pantoprazole 40 mg daily. He can use Zantac per box instruction if necessary for breakthrough heartburn. I have recommended upper endoscopy for direct visualization and for biopsy to confirm H. pylori eradication. We discussed the test today including the risks and benefits and he is agreeable to proceed  2.  Family history of colon cancer -- 2 second-degree relatives with colon rectal cancer at an early age. I recommended colorectal cancer screening at this time. We discussed colonoscopy including the risks and benefits and he is agreeable to proceed.  3.  Recent elevated liver enzymes -- unclear etiology, records  requested from primary care. Repeating CMP, CBC, hepatitis serologies today. Liver ultrasound was unrevealing

## 2013-07-29 NOTE — Patient Instructions (Signed)
You have been scheduled for an endoscopy/colonoscopy. Please follow written instructions given to you at your visit today. If you use inhalers (even only as needed), please bring them with you on the day of your procedure. Your physician has requested that you go to www.startemmi.com and enter the access code given to you at your visit today. This web site gives a general overview about your procedure. However, you should still follow specific instructions given to you by our office regarding your preparation for the procedure.  Your physician has requested that you go to the basement for lab work before leaving today.  We have sent the following medications to your pharmacy for you to pick up at your convenience: pantoprazole 40 mg daily, Zantac for breathrough heartburn. restora daily  Discontinue taking Omeprazole

## 2013-07-30 ENCOUNTER — Other Ambulatory Visit: Payer: Self-pay

## 2013-07-30 DIAGNOSIS — R7401 Elevation of levels of liver transaminase levels: Secondary | ICD-10-CM

## 2013-07-30 DIAGNOSIS — R740 Nonspecific elevation of levels of transaminase and lactic acid dehydrogenase [LDH]: Principal | ICD-10-CM

## 2013-07-30 LAB — HEPATITIS C ANTIBODY: HCV AB: NEGATIVE

## 2013-07-30 LAB — HEPATITIS B SURFACE ANTIBODY,QUALITATIVE: Hep B S Ab: NEGATIVE

## 2013-07-30 LAB — HEPATITIS B CORE ANTIBODY, TOTAL: Hep B Core Total Ab: NONREACTIVE

## 2013-07-30 LAB — HEPATITIS A ANTIBODY, TOTAL: HEP A TOTAL AB: NONREACTIVE

## 2013-07-30 LAB — HEPATITIS B SURFACE ANTIGEN: Hepatitis B Surface Ag: NEGATIVE

## 2013-08-05 ENCOUNTER — Ambulatory Visit (INDEPENDENT_AMBULATORY_CARE_PROVIDER_SITE_OTHER): Payer: BC Managed Care – PPO | Admitting: Internal Medicine

## 2013-08-05 ENCOUNTER — Other Ambulatory Visit: Payer: BC Managed Care – PPO

## 2013-08-05 DIAGNOSIS — R740 Nonspecific elevation of levels of transaminase and lactic acid dehydrogenase [LDH]: Principal | ICD-10-CM

## 2013-08-05 DIAGNOSIS — Z23 Encounter for immunization: Secondary | ICD-10-CM

## 2013-08-05 DIAGNOSIS — R7402 Elevation of levels of lactic acid dehydrogenase (LDH): Secondary | ICD-10-CM

## 2013-08-10 ENCOUNTER — Ambulatory Visit (AMBULATORY_SURGERY_CENTER): Payer: BC Managed Care – PPO | Admitting: Internal Medicine

## 2013-08-10 ENCOUNTER — Encounter: Payer: Self-pay | Admitting: Internal Medicine

## 2013-08-10 VITALS — BP 151/81 | HR 57 | Temp 96.6°F | Resp 17 | Ht 70.0 in | Wt 192.0 lb

## 2013-08-10 DIAGNOSIS — K2990 Gastroduodenitis, unspecified, without bleeding: Secondary | ICD-10-CM

## 2013-08-10 DIAGNOSIS — Z8619 Personal history of other infectious and parasitic diseases: Secondary | ICD-10-CM

## 2013-08-10 DIAGNOSIS — Z800 Family history of malignant neoplasm of digestive organs: Secondary | ICD-10-CM

## 2013-08-10 DIAGNOSIS — K2970 Gastritis, unspecified, without bleeding: Secondary | ICD-10-CM

## 2013-08-10 DIAGNOSIS — K219 Gastro-esophageal reflux disease without esophagitis: Secondary | ICD-10-CM

## 2013-08-10 DIAGNOSIS — R109 Unspecified abdominal pain: Secondary | ICD-10-CM

## 2013-08-10 DIAGNOSIS — D126 Benign neoplasm of colon, unspecified: Secondary | ICD-10-CM

## 2013-08-10 MED ORDER — SODIUM CHLORIDE 0.9 % IV SOLN: 500.0000 mL | INTRAVENOUS | Status: DC

## 2013-08-10 NOTE — Progress Notes (Signed)
Attempted # 22 in Anicubital x2.  Both attempts with brisk blood return but catheter would not advance.  Both removed and pressure applied to site.  No c/o of pain and no bruising or oozing from site. Pressure dressing applied to both sites.

## 2013-08-10 NOTE — Progress Notes (Signed)
A/ox3 pleased with MAC, report to Annette RN 

## 2013-08-10 NOTE — Progress Notes (Signed)
Called to room to assist during endoscopic procedure.  Patient ID and intended procedure confirmed with present staff. Received instructions for my participation in the procedure from the performing physician.  

## 2013-08-10 NOTE — Patient Instructions (Signed)
YOU HAD AN ENDOSCOPIC PROCEDURE TODAY AT THE [facility]: Refer to the procedure report that was given to you for any specific questions about what was found during the examination.  If the procedure report does not answer your questions, please call your gastroenterologist to clarify.  If you requested that your care partner not be given the details of your procedure findings, then the procedure report has been included in a sealed envelope for you to review at your convenience later.  YOU SHOULD EXPECT: Some feelings of bloating in the abdomen. Passage of more gas than usual.  Walking can help get rid of the air that was put into your GI tract during the procedure and reduce the bloating. If you had a lower endoscopy (such as a colonoscopy or flexible sigmoidoscopy) you may notice spotting of blood in your stool or on the toilet paper. If you underwent a bowel prep for your procedure, then you may not have a normal bowel movement for a few days.  DIET: Your first meal following the procedure should be a light meal and then it is ok to progress to your normal diet.  A half-sandwich or bowl of soup is an example of a good first meal.  Heavy or fried foods are harder to digest and may make you feel nauseous or bloated.  Likewise meals heavy in dairy and vegetables can cause extra gas to form and this can also increase the bloating.  Drink plenty of fluids but you should avoid alcoholic beverages for 24 hours.  ACTIVITY: Your care partner should take you home directly after the procedure.  You should plan to take it easy, moving slowly for the rest of the day.  You can resume normal activity the day after the procedure however you should NOT DRIVE or use heavy machinery for 24 hours (because of the sedation medicines used during the test).    SYMPTOMS TO REPORT IMMEDIATELY: A gastroenterologist can be reached at any hour.  During normal business hours, 8:30 AM to 5:00 PM Monday through Friday,  call [phone].  After hours and on weekends, please call the GI answering service at [phone] who will take a message and have the physician on call contact you.   Following lower endoscopy (colonoscopy or flexible sigmoidoscopy):  Excessive amounts of blood in the stool  Significant tenderness or worsening of abdominal pains  Swelling of the abdomen that is new, acute  Fever of 100F or higher  Following upper endoscopy (EGD)  Vomiting of blood or coffee ground material  New chest pain or pain under the shoulder blades  Painful or persistently difficult swallowing  New shortness of breath  Fever of 100F or higher  Black, tarry-looking stools  FOLLOW UP: If any biopsies were taken you will be contacted by phone or by letter within the next 1-3 weeks.  Call your gastroenterologist if you have not heard about the biopsies in 3 weeks.  Our staff will call the home number listed on your records the next business day following your procedure to check on you and address any questions or concerns that you may have at that time regarding the information given to you following your procedure. This is a courtesy call and so if there is no answer at the home number and we have not heard from you through the emergency physician on call, we will assume that you have returned to your regular daily activities without incident.  SIGNATURES/CONFIDENTIALITY: You and/or your care   partner have signed paperwork which will be entered into your electronic medical record.  These signatures attest to the fact that that the information above on your After Visit Summary has been reviewed and is understood.  Full responsibility of the confidentiality of this discharge information lies with you and/or your care-partner.    Handouts were given to your care partner on polyps and gastritis. You may resume your current medications today. Await biopsy results. Please call if any questions or concerns.

## 2013-08-10 NOTE — Op Note (Signed)
[place]  [address]. [place], [address]   ENDOSCOPY PROCEDURE REPORT  PATIENT: Preston, Manning  MR#: [id number] BIRTHDATE: [birth year] , 43  yrs. old GENDER: Male ENDOSCOPIST: Jerene Bears, MD PROCEDURE DATE:  08/10/2013 PROCEDURE:  EGD w/ biopsy ASA CLASS:     Class II INDICATIONS:  Dyspepsia.   history of GERD.  history of H. pylori treated twice in the past with Prevpak MEDICATIONS: MAC sedation, administered by CRNA and propofol (Diprivan) 420mg  IV (combined procedure) TOPICAL ANESTHETIC: none  DESCRIPTION OF PROCEDURE: After the risks benefits and alternatives of the procedure were thoroughly explained, informed consent was obtained.  The LB [id number] endoscope was introduced through the mouth and advanced to the second portion of the duodenum. Without limitations.  The instrument was slowly withdrawn as the mucosa was fully examined.    ESOPHAGUS: The mucosa of the esophagus appeared normal.   A slightly irregular Z-line was observed 40 cm from the incisors. Multiple biopsies were performed using cold forceps.  Sample obtained to rule out Barrett's esophagus.  STOMACH: Acute gastritis (inflammation) characterized by erythema was found in the gastric fundus and gastric body.  Biopsies were taken in the gastric body, antrum and angularis.  DUODENUM: The duodenal mucosa showed no abnormalities in the bulb and second portion of the duodenum.  Retroflexed views revealed no abnormalities.     The scope was then withdrawn from the patient and the procedure completed.  COMPLICATIONS: There were no complications.  ENDOSCOPIC IMPRESSION: 1.   The mucosa of the esophagus appeared normal 2.   Slightly irregular Z-line was observed 40 cm from the incisors; multiple biopsies 3.   Acute gastritis (inflammation) was found in the gastric fundus and gastric body; biopsies were taken in the antrum and angularis 4.   The duodenal mucosa showed no  abnormalities in the bulb and second portion of the duodenum  RECOMMENDATIONS: 1.  Await pathology results 2.  Follow-up of helicobacter pylori status, treat if indicated  eSigned:  Jerene Bears, MD 08/10/2013 1:20 PM CC:The Patient; Maurice Small

## 2013-08-10 NOTE — Progress Notes (Signed)
No complaints noted in the recovery room. Maw   

## 2013-08-10 NOTE — Op Note (Signed)
[place]  [address]. [place], [address]   COLONOSCOPY PROCEDURE REPORT  PATIENT: Preston Manning, Preston Manning  MR#: [id number] BIRTHDATE: [birth year] , 43  yrs. old GENDER: Male ENDOSCOPIST: Jerene Bears, MD PROCEDURE DATE:  08/10/2013 PROCEDURE:   Colonoscopy with snare polypectomy First Screening Colonoscopy - Avg.  risk and is 50 yrs.  old or older - No.  Prior Negative Screening - Now for repeat screening. N/A  History of Adenoma - Now for follow-up colonoscopy & has been > or = to 3 yrs.  N/A  Polyps Removed Today? Yes. ASA CLASS:   Class II INDICATIONS:elevated risk screening and Patient's family history of colon cancer, distant relatives. MEDICATIONS: MAC sedation, administered by CRNA and propofol (Diprivan) 420mg  IV  DESCRIPTION OF PROCEDURE:   After the risks benefits and alternatives of the procedure were thoroughly explained, informed consent was obtained.  A digital rectal exam revealed no rectal mass.   The LB [id number]  endoscope was introduced through the anus and advanced to the cecum, which was identified by both the appendix and ileocecal valve. No adverse events experienced. The quality of the prep was good, using MoviPrep  The instrument was then slowly withdrawn as the colon was fully examined.  COLON FINDINGS: A sessile polyp measuring 4 mm in size was found in the descending colon.  A polypectomy was performed with a cold snare.  The resection was complete and the polyp tissue was completely retrieved.   The colon mucosa was otherwise normal. Retroflexed views revealed hypertrophied anal papilla and a skin tag. The time to cecum=2 minutes 27 seconds.  Withdrawal time=11 minutes 07 seconds.  The scope was withdrawn and the procedure completed.  COMPLICATIONS: There were no complications.  ENDOSCOPIC IMPRESSION: 1.   Sessile polyp measuring 4 mm in size was found in the descending colon; polypectomy was performed with a cold snare 2.    The colon mucosa was otherwise normal  RECOMMENDATIONS: 1.  Await pathology results 2.  Repeat Colonoscopy in 5 years. 3.  You will receive a letter within 1-2 weeks with the results of your biopsy as well as final recommendations.  Please call my office if you have not received a letter after 3 weeks.   eSigned:  Jerene Bears, MD 08/10/2013 1:23 PM   cc: The Patient; Maurice Small

## 2013-08-11 ENCOUNTER — Telehealth: Payer: Self-pay | Admitting: *Deleted

## 2013-08-11 NOTE — Telephone Encounter (Signed)
No answer, left message to call if questions or concerns. 

## 2013-08-23 ENCOUNTER — Encounter: Payer: Self-pay | Admitting: Internal Medicine

## 2013-09-06 ENCOUNTER — Ambulatory Visit (INDEPENDENT_AMBULATORY_CARE_PROVIDER_SITE_OTHER): Payer: BC Managed Care – PPO | Admitting: Internal Medicine

## 2013-09-06 DIAGNOSIS — Z23 Encounter for immunization: Secondary | ICD-10-CM

## 2013-11-04 ENCOUNTER — Other Ambulatory Visit: Payer: Self-pay | Admitting: *Deleted

## 2013-11-04 DIAGNOSIS — Z800 Family history of malignant neoplasm of digestive organs: Secondary | ICD-10-CM

## 2013-11-04 DIAGNOSIS — Z8619 Personal history of other infectious and parasitic diseases: Secondary | ICD-10-CM

## 2013-11-04 DIAGNOSIS — R1010 Upper abdominal pain, unspecified: Secondary | ICD-10-CM

## 2013-11-04 MED ORDER — RANITIDINE HCL 150 MG PO TABS: 150.0000 mg | ORAL_TABLET | Freq: Two times a day (BID) | ORAL | Status: DC

## 2014-06-05 ENCOUNTER — Emergency Department (HOSPITAL_COMMUNITY)
Admission: EM | Admit: 2014-06-05 | Discharge: 2014-06-05 | Disposition: A | Discharge status: Home / Self Care | Payer: BLUE CROSS/BLUE SHIELD | Source: Home / Self Care | Attending: Family Medicine | Admitting: Family Medicine

## 2014-06-05 ENCOUNTER — Encounter (HOSPITAL_COMMUNITY): Payer: Self-pay | Admitting: *Deleted

## 2014-06-05 DIAGNOSIS — S76919A Strain of unspecified muscles, fascia and tendons at thigh level, unspecified thigh, initial encounter: Secondary | ICD-10-CM

## 2014-06-05 LAB — POCT URINALYSIS DIP (DEVICE)
Bilirubin Urine: NEGATIVE
Glucose, UA: NEGATIVE mg/dL
Hgb urine dipstick: NEGATIVE
Ketones, ur: NEGATIVE mg/dL
Leukocytes, UA: NEGATIVE
Nitrite: NEGATIVE
Protein, ur: NEGATIVE mg/dL
SPECIFIC GRAVITY, URINE: 1.015 (ref 1.005–1.030)
Urobilinogen, UA: 0.2 mg/dL (ref 0.0–1.0)
pH: 6.5 (ref 5.0–8.0)

## 2014-06-05 NOTE — ED Provider Notes (Signed)
CSN: [id number]     Arrival date & time 06/05/14  1052 History   First MD Initiated Contact with Patient 06/05/14 1245     Chief Complaint  Patient presents with  . Groin Swelling   (Consider location/radiation/quality/duration/timing/severity/associated sxs/prior Treatment) Patient is a 44 y.o. male presenting with male genitourinary complaint. The history is provided by the patient.  Male GU Problem Presenting symptoms: scrotal pain   Presenting symptoms: no dysuria and no penile pain   Context: not after injury   Relieved by:  None tried Worsened by:  Nothing tried Ineffective treatments:  None tried Associated symptoms: scrotal swelling   Associated symptoms: no abdominal pain, no fever, no flank pain, no hematuria, no penile swelling, no urinary frequency, no urinary hesitation, no urinary incontinence and no urinary retention   Risk factors comment:  H/o epididymitis yrs ago.   Past Medical History  Diagnosis Date  . Helicobacter pylori (H. pylori)   . GERD (gastroesophageal reflux disease)    Past Surgical History  Procedure Laterality Date  . Nasal sinus surgery     Family History  Problem Relation Age of Onset  . Colon cancer Maternal Uncle   . Diabetes      Maternal family   History  Substance Use Topics  . Smoking status: Never Smoker   . Smokeless tobacco: Never Used  . Alcohol Use: No    Review of Systems  Constitutional: Negative.  Negative for fever.  Gastrointestinal: Negative.  Negative for abdominal pain.  Genitourinary: Positive for scrotal swelling. Negative for bladder incontinence, dysuria, hesitancy, urgency, frequency, hematuria, flank pain, penile swelling, genital sores, penile pain and testicular pain.    Allergies  Review of patient's allergies indicates no known allergies.  Home Medications   Prior to Admission medications   Medication Sig Start Date End Date Taking? Authorizing Provider  Ascorbic Acid (VITAMIN C PO) Take 1 tablet  by mouth 3 (three) times daily.     Historical Provider, MD  GARLIC PO Take 1 capsule by mouth 3 (three) times daily.    Historical Provider, MD  glucosamine-chondroitin 500-400 MG tablet Take 1 tablet by mouth 2 (two) times daily.     Historical Provider, MD  ibuprofen (ADVIL,MOTRIN) 200 MG tablet Take 400 mg by mouth 3 (three) times daily as needed (pain).    Historical Provider, MD  MOVIPREP 100 G SOLR Use per prep instruction 07/29/13   Jerene Bears, MD  nystatin (MYCOSTATIN) 100000 UNIT/ML suspension Take 5 mLs (500,000 Units total) by mouth 4 (four) times daily. Swish and swallow x 1 week 07/22/13   Gregor Hams, MD  OREGANO PO Take 1 capsule by mouth daily.    Historical Provider, MD  pantoprazole (PROTONIX) 40 MG tablet Take 1 tablet (40 mg total) by mouth daily. 07/29/13   Jerene Bears, MD  Probiotic Product (RESTORA) CAPS Take 1 capsule by mouth daily. 07/29/13   Jerene Bears, MD  pseudoephedrine (SUDAFED) 30 MG tablet Take 30 mg by mouth daily as needed for congestion.    Historical Provider, MD  ranitidine (ZANTAC) 150 MG tablet Take 1 tablet (150 mg total) by mouth 2 (two) times daily. 11/04/13   Jerene Bears, MD  TURMERIC PO Take 1 capsule by mouth daily.    Historical Provider, MD   BP 156/94 mmHg  Pulse 63  Temp(Src) 97.6 F (36.4 C) (Oral)  Resp 16  SpO2 100% Physical Exam  Constitutional: He is oriented to person, place, and time.  He appears well-developed and well-nourished.  Abdominal: Hernia confirmed negative in the right inguinal area and confirmed negative in the left inguinal area.  Genitourinary: Testes normal and penis normal. Cremasteric reflex is present. Right testis shows no mass and no tenderness. Left testis shows no mass and no tenderness. Circumcised.  Lymphadenopathy:       Right: No inguinal adenopathy present.       Left: No inguinal adenopathy present.  Neurological: He is alert and oriented to person, place, and time.  Skin: Skin is warm and dry.  Nursing  note and vitals reviewed.   ED Course  Procedures (including critical care time) Labs Review Labs Reviewed  POCT URINALYSIS DIP (DEVICE)   U/a neg  Imaging Review No results found.   MDM   1. Muscle strain of thigh, unspecified laterality, initial encounter        Billy Fischer, MD 06/05/14 1314

## 2014-06-05 NOTE — Discharge Instructions (Signed)
Heat, advil and stretch as needed.

## 2014-06-05 NOTE — ED Notes (Signed)
Pt    Reports    Pain  And  Swollen  Sensation  In scrotal  Area             Since  Last  Week       denys    Any  Burning   Or  Sores        denys  Any  Discharge      denys  Any  specefic  Injury  But states  He  Works  [employer]

## 2014-09-15 ENCOUNTER — Other Ambulatory Visit: Payer: Self-pay | Admitting: Internal Medicine

## 2015-03-01 IMAGING — US US ABDOMEN COMPLETE
1 series · 14 of 25 positions shown · non-contrast
Comparison: None.

CLINICAL DATA: Elevated liver function tests

EXAM:
ULTRASOUND ABDOMEN COMPLETE

[Series 1: us abdomen complete · 0.35mm/px · 14 of 68 slices shown]
[im 1/68]
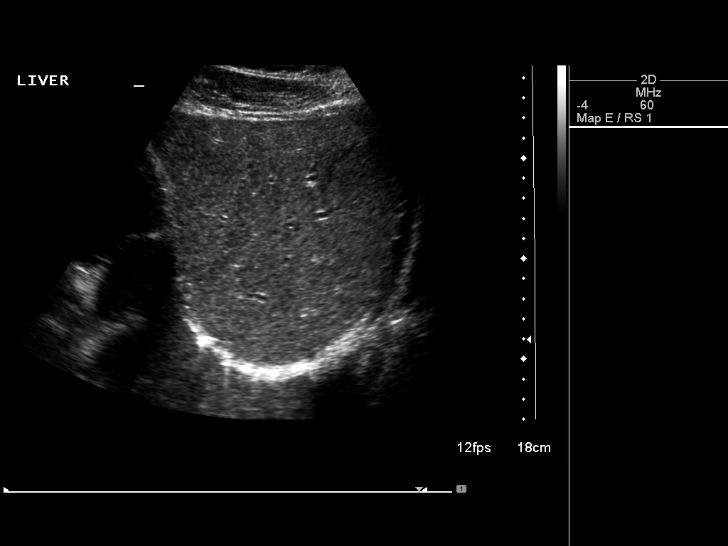
[im 6/68]
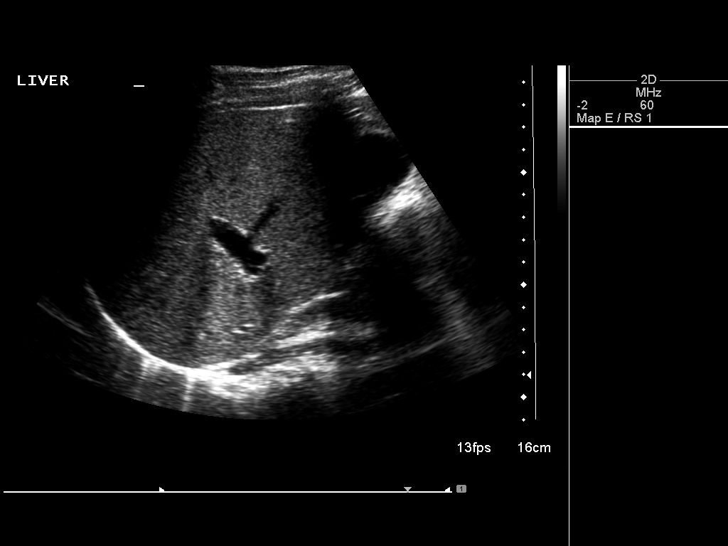
[im 12/68]
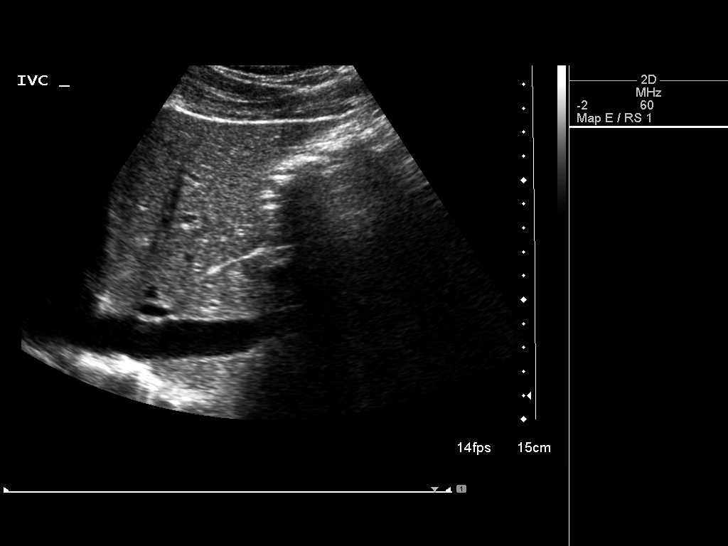
[im 17/68]
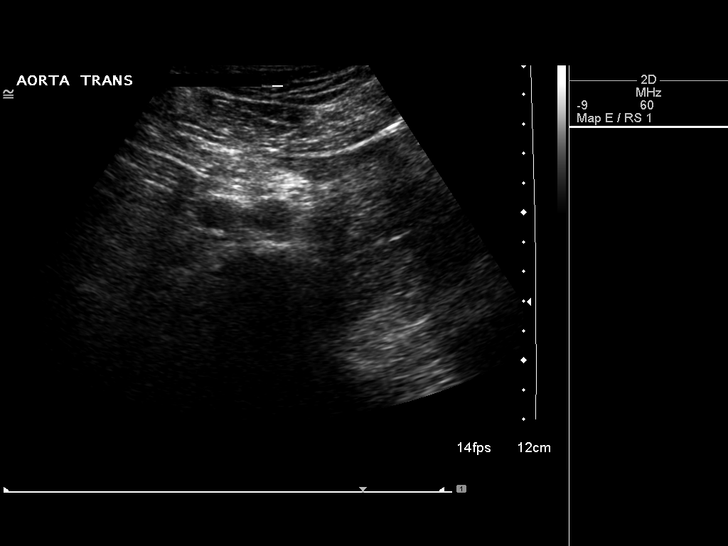
[im 23/68]
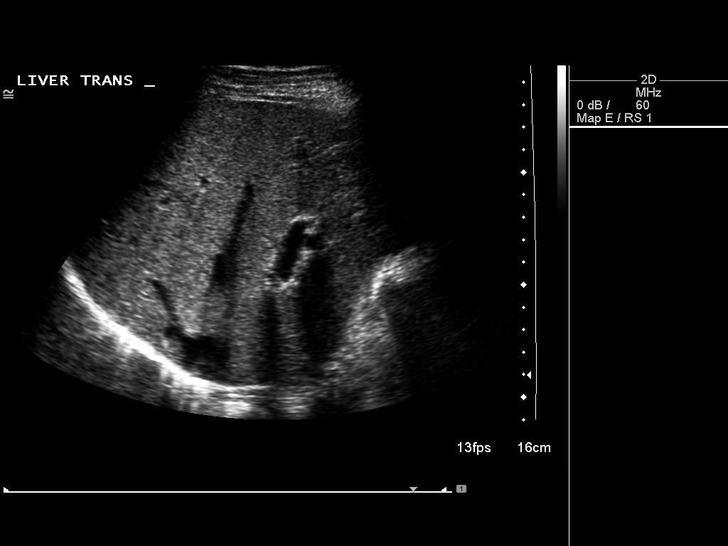
[im 26/68]
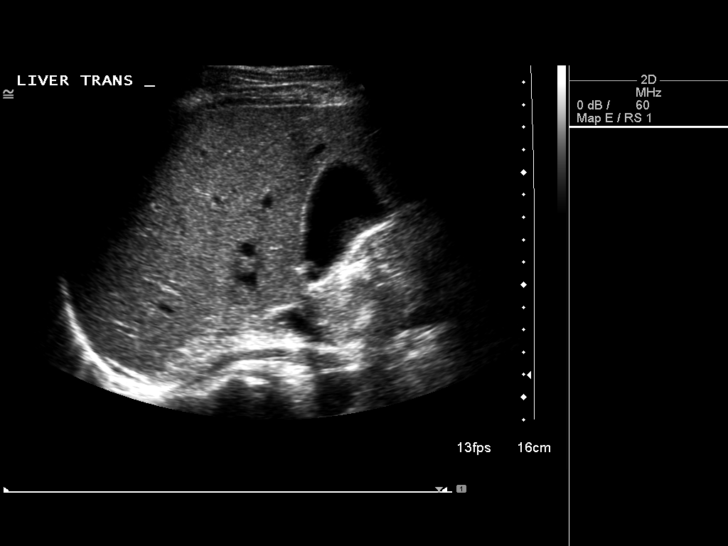
[im 31/68]
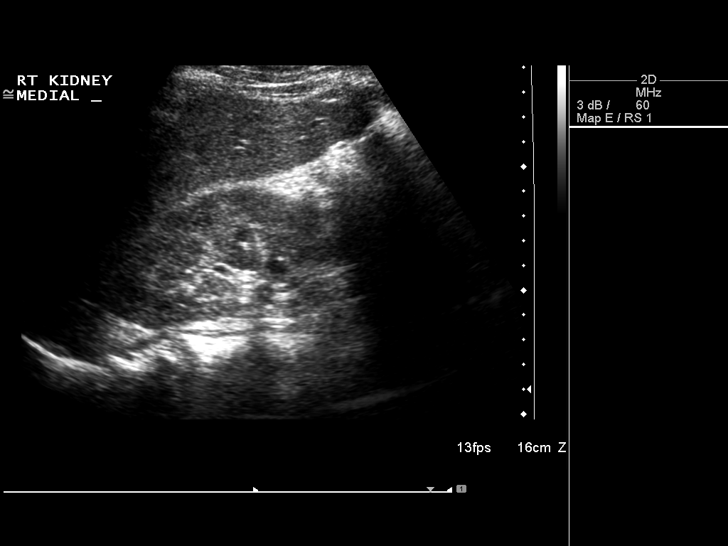
[im 37/68]
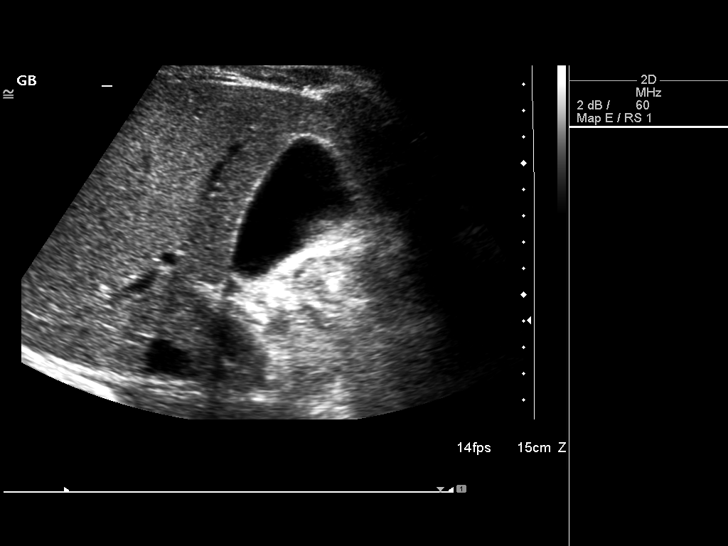
[im 42/68]
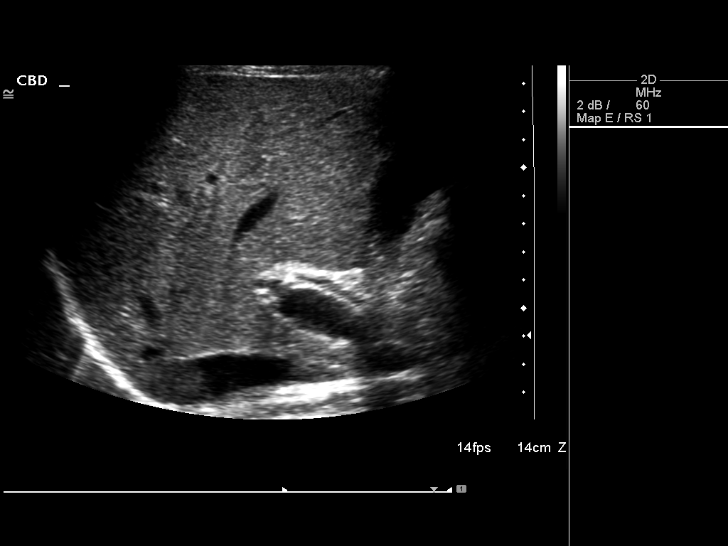
[im 45/68]
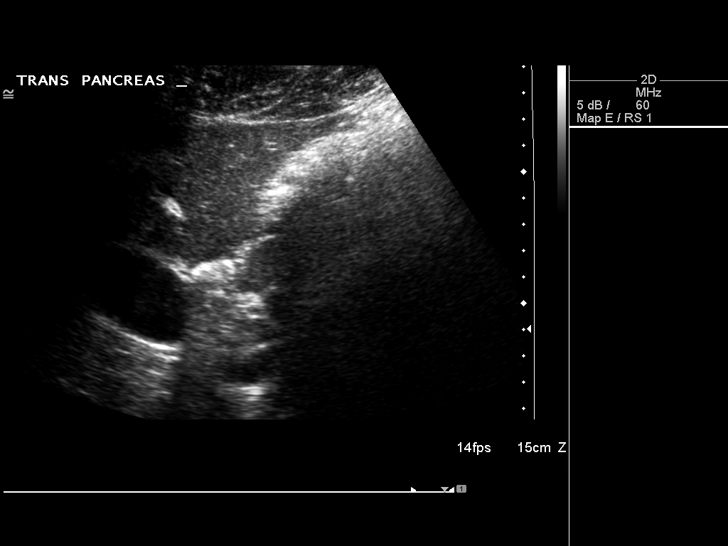
[im 51/68]
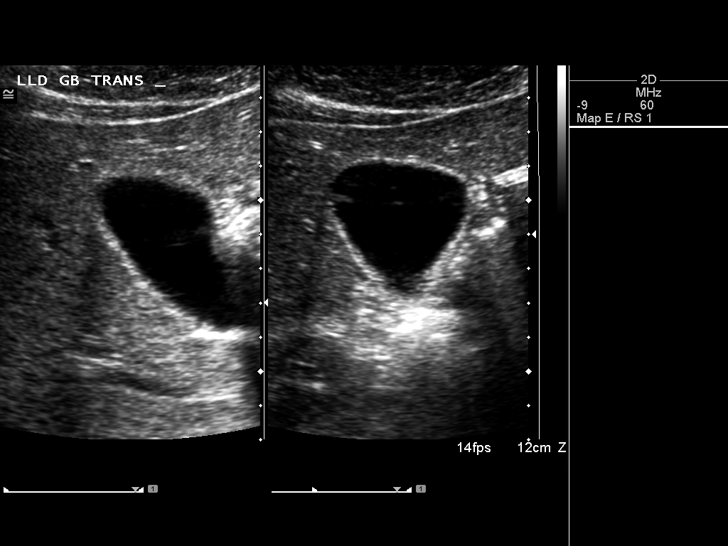
[im 56/68]
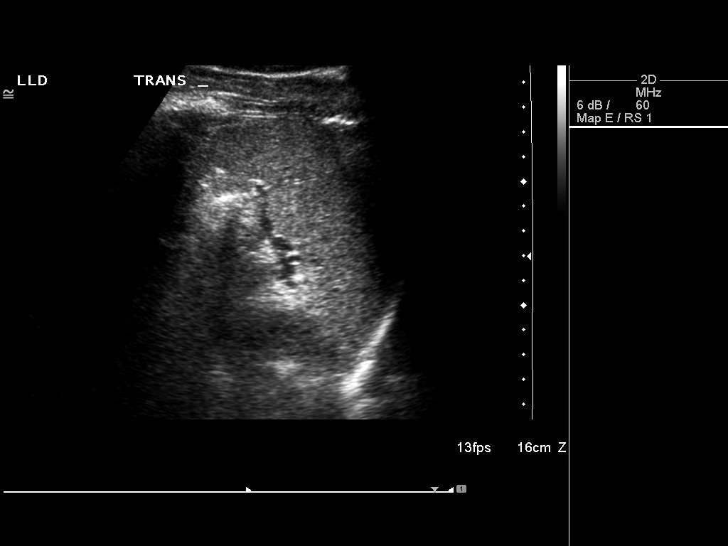
[im 62/68]
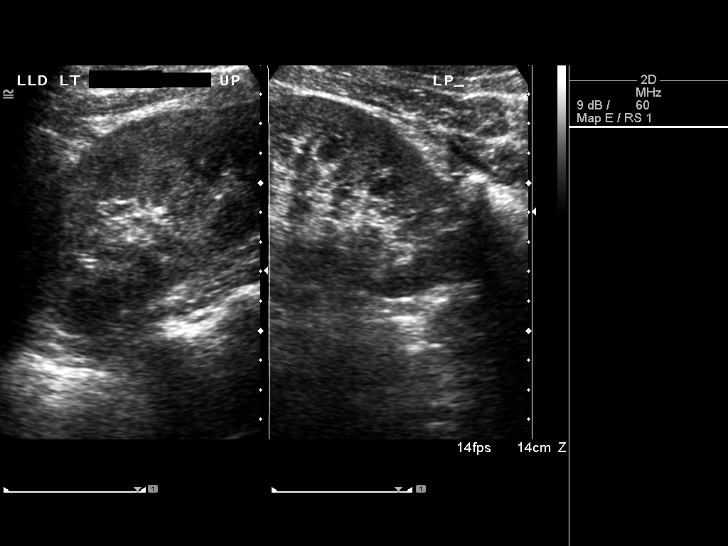
[im 68/68]
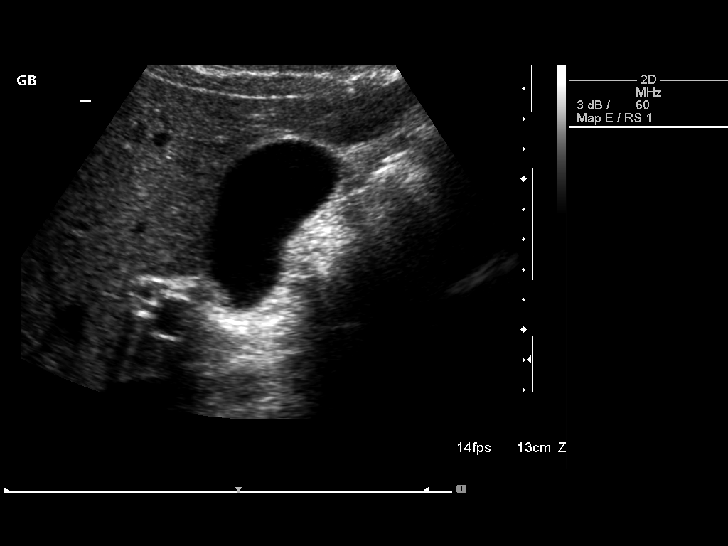

[14 of 25 positions shown; findings below may reference images not displayed]

FINDINGS: Gallbladder:

No gallstones or wall thickening visualized. No sonographic Murphy
sign noted.

Common bile duct:

Diameter: 4.8 mm

Liver:

No focal lesion identified. Within normal limits in parenchymal
echogenicity.

IVC:

No abnormality visualized.

Pancreas:

Visualized portion unremarkable.

Spleen:

Size and appearance within normal limits.

Right Kidney:

Length: 12.7 cm. Echogenicity within normal limits. No mass or
hydronephrosis visualized.

Left Kidney:

Length: 13.8 cm. Echogenicity within normal limits. No mass or
hydronephrosis visualized.

Abdominal aorta:

No aneurysm visualized.

Other findings:

None.
IMPRESSION: Normal abdominal ultrasound

## 2015-09-14 ENCOUNTER — Other Ambulatory Visit: Payer: Self-pay | Admitting: Internal Medicine

## 2016-05-06 ENCOUNTER — Encounter (HOSPITAL_COMMUNITY): Payer: Self-pay | Admitting: Emergency Medicine

## 2016-05-06 ENCOUNTER — Ambulatory Visit (HOSPITAL_COMMUNITY)
Admission: EM | Admit: 2016-05-06 | Discharge: 2016-05-06 | Disposition: A | Discharge status: Home / Self Care | Payer: BLUE CROSS/BLUE SHIELD | Attending: Internal Medicine | Admitting: Internal Medicine

## 2016-05-06 DIAGNOSIS — R3989 Other symptoms and signs involving the genitourinary system: Secondary | ICD-10-CM

## 2016-05-06 DIAGNOSIS — R319 Hematuria, unspecified: Secondary | ICD-10-CM | POA: Insufficient documentation

## 2016-05-06 DIAGNOSIS — N368 Other specified disorders of urethra: Secondary | ICD-10-CM | POA: Diagnosis not present

## 2016-05-06 MED ORDER — PHENAZOPYRIDINE HCL 200 MG PO TABS: 200.0000 mg | ORAL_TABLET | Freq: Three times a day (TID) | ORAL | 0 refills | Status: AC

## 2016-05-06 NOTE — ED Provider Notes (Signed)
CSN: [id number]     Arrival date & time 05/06/16  1040 History   None    Chief Complaint  Patient presents with  . Hematuria   (Consider location/radiation/quality/duration/timing/severity/associated sxs/prior Treatment) Patient c/o blood in urine for 2 days.  He states he was getting ready to void, and had to void really bad, and was pulling his penis out from his pants and then it got caught and this caused severe penile discomfort which is still present.  He denies any unprotected sex. He denies any urethral discharge.    The history is provided by the patient.  Hematuria  This is a new problem. The problem occurs constantly. Nothing aggravates the symptoms. Nothing relieves the symptoms. He has tried nothing for the symptoms.    Past Medical History:  Diagnosis Date  . GERD (gastroesophageal reflux disease)   . Helicobacter pylori (H. pylori)    Past Surgical History:  Procedure Laterality Date  . NASAL SINUS SURGERY     Family History  Problem Relation Age of Onset  . Colon cancer Maternal Uncle   . Diabetes      Maternal family   Social History  Substance Use Topics  . Smoking status: Never Smoker  . Smokeless tobacco: Never Used  . Alcohol use No    Review of Systems  Constitutional: Negative.   HENT: Negative.   Eyes: Negative.   Respiratory: Negative.   Cardiovascular: Negative.   Endocrine: Negative.   Genitourinary: Positive for hematuria.  Musculoskeletal: Negative.   Allergic/Immunologic: Negative.   Neurological: Negative.   Hematological: Negative.   Psychiatric/Behavioral: Negative.     Allergies  Patient has no known allergies.  Home Medications   Prior to Admission medications   Medication Sig Start Date End Date Taking? Authorizing Provider  Ascorbic Acid (VITAMIN C PO) Take 1 tablet by mouth 3 (three) times daily.     Historical Provider, MD  GARLIC PO Take 1 capsule by mouth 3 (three) times daily.    Historical Provider, MD   glucosamine-chondroitin 500-400 MG tablet Take 1 tablet by mouth 2 (two) times daily.     Historical Provider, MD  ibuprofen (ADVIL,MOTRIN) 200 MG tablet Take 400 mg by mouth 3 (three) times daily as needed (pain).    Historical Provider, MD  MOVIPREP 100 G SOLR Use per prep instruction 07/29/13   Jerene Bears, MD  nystatin (MYCOSTATIN) 100000 UNIT/ML suspension Take 5 mLs (500,000 Units total) by mouth 4 (four) times daily. Swish and swallow x 1 week 07/22/13   Gregor Hams, MD  OREGANO PO Take 1 capsule by mouth daily.    Historical Provider, MD  pantoprazole (PROTONIX) 40 MG tablet TAKE 1 TABLET (40 MG TOTAL) BY MOUTH DAILY. 09/15/14   Jerene Bears, MD  phenazopyridine (PYRIDIUM) 200 MG tablet Take 1 tablet (200 mg total) by mouth 3 (three) times daily. 05/06/16   Lysbeth Penner, FNP  Probiotic Product (RESTORA) CAPS Take 1 capsule by mouth daily. 07/29/13   Jerene Bears, MD  pseudoephedrine (SUDAFED) 30 MG tablet Take 30 mg by mouth daily as needed for congestion.    Historical Provider, MD  ranitidine (ZANTAC) 150 MG tablet Take 1 tablet (150 mg total) by mouth 2 (two) times daily. 11/04/13   Jerene Bears, MD  TURMERIC PO Take 1 capsule by mouth daily.    Historical Provider, MD   Meds Ordered and Administered this Visit  Medications - No data to display  BP (!) 157/90 (  BP Location: Left Arm)   Pulse 64   Temp 97.6 F (36.4 C) (Oral)   Resp 16   Ht 5\' 10"  (1.778 m)   Wt 192 lb (87.1 kg)   SpO2 100%   BMI 27.55 kg/m  No data found.   Physical Exam  Constitutional: He appears well-developed and well-nourished.  HENT:  Head: Normocephalic and atraumatic.  Eyes: Conjunctivae and EOM are normal. Pupils are equal, round, and reactive to light.  Neck: Normal range of motion. Neck supple.  Cardiovascular: Normal rate, regular rhythm and normal heart sounds.   Pulmonary/Chest: Effort normal and breath sounds normal.  Abdominal: Soft. Bowel sounds are normal.  Genitourinary: Penis normal.   Nursing note and vitals reviewed.   Urgent Care Course     Procedures (including critical care time)  Labs Review Labs Reviewed  URINE CYTOLOGY ANCILLARY ONLY    Imaging Review No results found.   Visual Acuity Review  Right Eye Distance:   Left Eye Distance:   Bilateral Distance:    Right Eye Near:   Left Eye Near:    Bilateral Near:         MDM   1. Urethral bleeding   2. Urethral pain    Pyridum 200mg  one po tid x 2 days #6 Push po fluids  UA cytology - GC Chlamydia and tric  Patient declines empiric tx for STD.      Lysbeth Penner, FNP 05/06/16 1328

## 2016-05-06 NOTE — ED Triage Notes (Signed)
PT reports blood in urine that started last night.

## 2016-05-07 LAB — URINE CYTOLOGY ANCILLARY ONLY
Chlamydia: NEGATIVE
Neisseria Gonorrhea: NEGATIVE
Trichomonas: NEGATIVE

## 2016-10-03 ENCOUNTER — Encounter (HOSPITAL_COMMUNITY): Payer: Self-pay | Admitting: Emergency Medicine

## 2016-10-03 ENCOUNTER — Ambulatory Visit (HOSPITAL_COMMUNITY)
Admission: EM | Admit: 2016-10-03 | Discharge: 2016-10-03 | Disposition: A | Discharge status: Home / Self Care | Payer: BLUE CROSS/BLUE SHIELD | Attending: Family Medicine | Admitting: Family Medicine

## 2016-10-03 DIAGNOSIS — A630 Anogenital (venereal) warts: Secondary | ICD-10-CM | POA: Diagnosis not present

## 2016-10-03 NOTE — ED Provider Notes (Signed)
[place]    CSN: [id number] Arrival date & time: 10/03/16  1145     History   Chief Complaint Chief Complaint  Patient presents with  . Skin Problem    HPI Preston Manning is a 46 y.o. male.   Noticed a mole or freckle in groin area approx 5 years ago.  Patient shaves this area and thought related to shaving.  Area is larger, but not painful      Past Medical History:  Diagnosis Date  . GERD (gastroesophageal reflux disease)   . Helicobacter pylori (H. pylori)     Patient Active Problem List   Diagnosis Date Noted  . GERD (gastroesophageal reflux disease) 07/29/2013  . Family hx of colon cancer 07/29/2013  . History of Helicobacter pylori infection 07/29/2013    Past Surgical History:  Procedure Laterality Date  . NASAL SINUS SURGERY         Home Medications    Prior to Admission medications   Medication Sig Start Date End Date Taking? Authorizing Provider  montelukast (SINGULAIR) 10 MG tablet Take 10 mg by mouth at bedtime.   Yes [provider]  Ascorbic Acid (VITAMIN C PO) Take 1 tablet by mouth 3 (three) times daily.     [provider]  GARLIC PO Take 1 capsule by mouth 3 (three) times daily.    [provider]  glucosamine-chondroitin 500-400 MG tablet Take 1 tablet by mouth 2 (two) times daily.     [provider]  ibuprofen (ADVIL,MOTRIN) 200 MG tablet Take 400 mg by mouth 3 (three) times daily as needed (pain).    [provider]  nystatin (MYCOSTATIN) 100000 UNIT/ML suspension Take 5 mLs (500,000 Units total) by mouth 4 (four) times daily. Swish and swallow x 1 week 07/22/13   Gregor Hams, MD  OREGANO PO Take 1 capsule by mouth daily.    [provider]  pantoprazole (PROTONIX) 40 MG tablet TAKE 1 TABLET (40 MG TOTAL) BY MOUTH DAILY. 09/15/14   Pyrtle, Lajuan Lines, MD  phenazopyridine (PYRIDIUM) 200 MG tablet Take 1 tablet (200 mg total) by mouth 3 (three) times daily. 05/06/16   Lysbeth Penner, FNP  Probiotic Product (RESTORA) CAPS Take 1 capsule by mouth daily. 07/29/13   Pyrtle, Lajuan Lines, MD  pseudoephedrine (SUDAFED) 30 MG tablet Take 30 mg by mouth daily as needed for congestion.    [provider]  ranitidine (ZANTAC) 150 MG tablet Take 1 tablet (150 mg total) by mouth 2 (two) times daily. 11/04/13   Pyrtle, Lajuan Lines, MD  TURMERIC PO Take 1 capsule by mouth daily.    [provider]    Family History Family History  Problem Relation Age of Onset  . Colon cancer Maternal Uncle   . Diabetes Unknown        Maternal family    Social History Social History  Substance Use Topics  . Smoking status: Never Smoker  . Smokeless tobacco: Never Used  . Alcohol use No     Allergies   Patient has no known allergies.   Review of Systems Review of Systems  Skin: Positive for rash.  All other systems reviewed and are negative.    Physical Exam Triage Vital Signs ED Triage Vitals [10/03/16 1317]  Enc Vitals Group     BP (!) 150/89     Pulse Rate (!) 51     Resp 18     Temp 97.6 F (36.4 C)  Temp Source Oral     SpO2 100 %     Weight      Height      Head Circumference      Peak Flow      Pain Score      Pain Loc      Pain Edu?      Excl. in [place]?    No data found.   Updated Vital Signs BP (!) 150/89 (BP Location: Right Arm) Comment: large cuff  Pulse (!) 51   Temp 97.6 F (36.4 C) (Oral)   Resp 18   SpO2 100%   Visual Acuity Right Eye Distance:   Left Eye Distance:   Bilateral Distance:    Right Eye Near:   Left Eye Near:    Bilateral Near:     Physical Exam  Constitutional: He appears well-developed and well-nourished.  Genitourinary:  Genitourinary Comments: Patient has a 3 mm flat papule on the proximal left shaft of his penis.  Nursing note and vitals reviewed.    UC Treatments / Results  Labs (all labs ordered are listed, but only abnormal results are displayed) Labs Reviewed - No data to display  EKG  EKG  Interpretation None       Radiology No results found.  Procedures Procedures (including critical care time)  Medications Ordered in UC Medications - No data to display   Initial Impression / Assessment and Plan / UC Course  I have reviewed the triage vital signs and the nursing notes.  Pertinent labs & imaging results that were available during my care of the patient were reviewed by me and considered in my medical decision making (see chart for details).     Final Clinical Impressions(s) / UC Diagnoses   Final diagnoses:  Penile wart    New Prescriptions Current Discharge Medication List     Referred to Dr. Diona Fanti  Controlled Substance Prescriptions [place] Controlled Substance Registry consulted? Not Applicable   Robyn Haber, MD 10/03/16 1331

## 2016-10-03 NOTE — Discharge Instructions (Signed)
I believe this is a flat wart.  There are several treatment options, but these need to be discussed with the urologist to whom I have referred you.

## 2016-10-03 NOTE — ED Triage Notes (Signed)
Noticed a mole or freckle in groin area approx 5 years ago.  Patient shaves this area and thought related to shaving.  Area is larger, but not painful

## 2018-07-03 ENCOUNTER — Encounter: Payer: Self-pay | Admitting: *Deleted

## 2018-07-07 ENCOUNTER — Encounter: Payer: Self-pay | Admitting: Internal Medicine

## 2019-10-28 ENCOUNTER — Encounter (HOSPITAL_COMMUNITY): Payer: Self-pay

## 2019-10-28 ENCOUNTER — Ambulatory Visit (INDEPENDENT_AMBULATORY_CARE_PROVIDER_SITE_OTHER): Payer: BC Managed Care – PPO

## 2019-10-28 ENCOUNTER — Other Ambulatory Visit: Payer: Self-pay

## 2019-10-28 ENCOUNTER — Ambulatory Visit (HOSPITAL_COMMUNITY)
Admission: EM | Admit: 2019-10-28 | Discharge: 2019-10-28 | Disposition: A | Discharge status: Home / Self Care | Payer: BC Managed Care – PPO | Attending: Family Medicine | Admitting: Family Medicine

## 2019-10-28 DIAGNOSIS — M25532 Pain in left wrist: Secondary | ICD-10-CM | POA: Diagnosis not present

## 2019-10-28 MED ORDER — KETOROLAC TROMETHAMINE 30 MG/ML IJ SOLN
INTRAMUSCULAR | Status: AC
  Filled 2019-10-28: qty 1

## 2019-10-28 MED ORDER — KETOROLAC TROMETHAMINE 30 MG/ML IJ SOLN
30.0000 mg | Freq: Once | INTRAMUSCULAR | Status: AC
  Administered 2019-10-28: 30 mg via INTRAMUSCULAR

## 2019-10-28 MED ORDER — IBUPROFEN 600 MG PO TABS: 600.0000 mg | ORAL_TABLET | Freq: Four times a day (QID) | ORAL | 0 refills | Status: AC | PRN

## 2019-10-28 NOTE — ED Provider Notes (Signed)
[place]    CSN: [id number] Arrival date & time: 10/28/19  1742      History   Chief Complaint Chief Complaint  Patient presents with  . Hand Injury  . Wrist Pain    HPI Preston Manning is a 49 y.o. male.   He is presenting with left wrist pain.  The pain has been ongoing for couple of days.  It seems to have gotten worse.  He is unable to flex his wrist.  Has trouble with flexion of his fingers.  Has limited pronation and supination of the wrist.  HPI  Past Medical History:  Diagnosis Date  . GERD (gastroesophageal reflux disease)   . Helicobacter pylori (H. pylori)     Patient Active Problem List   Diagnosis Date Noted  . GERD (gastroesophageal reflux disease) 07/29/2013  . Family hx of colon cancer 07/29/2013  . History of Helicobacter pylori infection 07/29/2013    Past Surgical History:  Procedure Laterality Date  . NASAL SINUS SURGERY         Home Medications    Prior to Admission medications   Medication Sig Start Date End Date Taking? Authorizing Provider  Ascorbic Acid (VITAMIN C PO) Take 1 tablet by mouth 3 (three) times daily.     [provider]  GARLIC PO Take 1 capsule by mouth 3 (three) times daily.    [provider]  glucosamine-chondroitin 500-400 MG tablet Take 1 tablet by mouth 2 (two) times daily.     [provider]  ibuprofen (ADVIL) 600 MG tablet Take 1 tablet (600 mg total) by mouth every 6 (six) hours as needed. 10/28/19   Rosemarie Ax, MD  montelukast (SINGULAIR) 10 MG tablet Take 10 mg by mouth at bedtime.    [provider]  nystatin (MYCOSTATIN) 100000 UNIT/ML suspension Take 5 mLs (500,000 Units total) by mouth 4 (four) times daily. Swish and swallow x 1 week 07/22/13   Gregor Hams, MD  OREGANO PO Take 1 capsule by mouth daily.    [provider]  pantoprazole (PROTONIX) 40 MG tablet TAKE 1 TABLET (40 MG TOTAL) BY MOUTH DAILY. 09/15/14   Pyrtle, Lajuan Lines, MD  phenazopyridine  (PYRIDIUM) 200 MG tablet Take 1 tablet (200 mg total) by mouth 3 (three) times daily. 05/06/16   Lysbeth Penner, FNP  Probiotic Product (RESTORA) CAPS Take 1 capsule by mouth daily. 07/29/13   Pyrtle, Lajuan Lines, MD  pseudoephedrine (SUDAFED) 30 MG tablet Take 30 mg by mouth daily as needed for congestion.    [provider]  ranitidine (ZANTAC) 150 MG tablet Take 1 tablet (150 mg total) by mouth 2 (two) times daily. 11/04/13   Pyrtle, Lajuan Lines, MD  TURMERIC PO Take 1 capsule by mouth daily.    [provider]    Family History Family History  Problem Relation Age of Onset  . Colon cancer Maternal Uncle   . Diabetes Other        Maternal family    Social History Social History   Tobacco Use  . Smoking status: Never Smoker  . Smokeless tobacco: Never Used  Substance Use Topics  . Alcohol use: No  . Drug use: No     Allergies   Patient has no known allergies.   Review of Systems Review of Systems  See HPI  Physical Exam Triage Vital Signs ED Triage Vitals  Enc Vitals Group     BP 10/28/19 1850 (!) 167/100  Pulse Rate 10/28/19 1850 66     Resp 10/28/19 1850 20     Temp 10/28/19 1850 98 F (36.7 C)     Temp Source 10/28/19 1850 Oral     SpO2 10/28/19 1850 100 %     Weight --      Height --      Head Circumference --      Peak Flow --      Pain Score 10/28/19 1846 8     Pain Loc --      Pain Edu? --      Excl. in [place]? --    No data found.  Updated Vital Signs BP (!) 167/100 (BP Location: Right Arm)   Pulse 66   Temp 98 F (36.7 C) (Oral)   Resp 20   SpO2 100%   Visual Acuity Right Eye Distance:   Left Eye Distance:   Bilateral Distance:    Right Eye Near:   Left Eye Near:    Bilateral Near:     Physical Exam Gen: NAD, alert, cooperative with exam, well-appearing ENT: normal lips, normal nasal mucosa,  Neuro: normal tone, normal sensation to touch Psych:  normal insight, alert and oriented MSK:  Left wrist:  Swelling over the  ulnar aspect of the wrist. Limited flexion of the fingers. No malrotation or misalignment. Tenderness to palpation over the ulnar aspect of the carpal bones. Limited flexion extension of the wrist. Neurovascularly intact   UC Treatments / Results  Labs (all labs ordered are listed, but only abnormal results are displayed) Labs Reviewed - No data to display  EKG   Radiology DG Wrist Complete Left  Result Date: 10/28/2019 CLINICAL DATA:  Pain EXAM: LEFT WRIST - COMPLETE 3+ VIEW COMPARISON:  None. FINDINGS: There is no evidence of fracture or dislocation. There is no evidence of arthropathy or other focal bone abnormality. Soft tissues are unremarkable. IMPRESSION: Negative. Electronically Signed   By: Constance Holster M.D.   On: 10/28/2019 19:22    Procedures Procedures (including critical care time)  Medications Ordered in UC Medications  ketorolac (TORADOL) 30 MG/ML injection 30 mg (has no administration in time range)    Initial Impression / Assessment and Plan / UC Course  I have reviewed the triage vital signs and the nursing notes.  Pertinent labs & imaging results that were available during my care of the patient were reviewed by me and considered in my medical decision making (see chart for details).     Preston Manning is a 49 year old male that is presenting with left wrist pain.  He had an injury a few days ago and now presenting with loss of range of motion and significant pain.  X-rays were negative for fracture.  Will place in a volar splint.  Provided Toradol.  Sent ibuprofen.  Counseled on follow-up.  Final Clinical Impressions(s) / UC Diagnoses   Final diagnoses:  Left wrist pain     Discharge Instructions     Please try ice  Please use ibuprofen  Please follow up if your symptoms fail to improve.     ED Prescriptions    Medication Sig Dispense Auth. Provider   ibuprofen (ADVIL) 600 MG tablet Take 1 tablet (600 mg total) by mouth every 6 (six) hours  as needed. 30 tablet Rosemarie Ax, MD     PDMP not reviewed this encounter.   Rosemarie Ax, MD 10/28/19 [phone]

## 2019-10-28 NOTE — Progress Notes (Signed)
Orthopedic Tech Progress Note Patient Details:  Preston Manning 12-16-1970 [id number]  Ortho Devices Type of Ortho Device: Volar splint Ortho Device/Splint Location: LUE Ortho Device/Splint Interventions: Application   Post Interventions Patient Tolerated: Well Instructions Provided: Care of device   Preston Manning [name] 10/28/2019, 8:38 PM

## 2019-10-28 NOTE — Discharge Instructions (Addendum)
Please try ice  Please use ibuprofen  Please follow up if your symptoms fail to improve.

## 2019-10-28 NOTE — ED Triage Notes (Signed)
Pt presents with left hand/wristinjury after dropping something it a week ago at work.

## 2019-10-29 ENCOUNTER — Ambulatory Visit: Payer: Self-pay

## 2019-10-29 ENCOUNTER — Encounter: Payer: Self-pay | Admitting: Family Medicine

## 2019-10-29 ENCOUNTER — Ambulatory Visit (INDEPENDENT_AMBULATORY_CARE_PROVIDER_SITE_OTHER): Payer: BC Managed Care – PPO | Admitting: Family Medicine

## 2019-10-29 VITALS — Ht 70.0 in | Wt 195.0 lb

## 2019-10-29 DIAGNOSIS — M25532 Pain in left wrist: Secondary | ICD-10-CM | POA: Diagnosis not present

## 2019-10-29 MED ORDER — METHYLPREDNISOLONE ACETATE 40 MG/ML IJ SUSP
40.0000 mg | Freq: Once | INTRAMUSCULAR | Status: AC
  Administered 2019-10-29: 40 mg via INTRAMUSCULAR

## 2019-10-29 MED ORDER — PREDNISONE 5 MG PO TABS: ORAL_TABLET | ORAL | 0 refills | Status: DC

## 2019-10-29 NOTE — Progress Notes (Signed)
Preston Manning - 49 y.o. male MRN [id number]  Date of birth: November 29, 1970  SUBJECTIVE:  Including CC & ROS.  Chief Complaint  Patient presents with  . Wrist Injury    left x 2 weeks    Preston Manning is a 49 y.o. male that is presenting with left wrist pain.  Has been ongoing for a few days.  Initially hit his wrist on a piece of metal.  He continued to work out and since that time his wrist has gotten progressively worse.  It is severe to where he has trouble with any movement and severe pain with touching.  No improvement with Toradol..  Independent review of the left wrist x-ray from 10/7 shows no acute abnormalities.   Review of Systems See HPI   HISTORY: Past Medical, Surgical, Social, and Family History Reviewed & Updated per EMR.   Pertinent Historical Findings include:  Past Medical History:  Diagnosis Date  . GERD (gastroesophageal reflux disease)   . Helicobacter pylori (H. pylori)     Past Surgical History:  Procedure Laterality Date  . NASAL SINUS SURGERY      Family History  Problem Relation Age of Onset  . Colon cancer Maternal Uncle   . Diabetes Other        Maternal family    Social History   Socioeconomic History  . Marital status: Single    Spouse name: Not on file  . Number of children: 3  . Years of education: Not on file  . Highest education level: Not on file  Occupational History  . Occupation: Orthoptist: [employer]  Tobacco Use  . Smoking status: Never Smoker  . Smokeless tobacco: Never Used  Substance and Sexual Activity  . Alcohol use: No  . Drug use: No  . Sexual activity: Not on file  Other Topics Concern  . Not on file  Social History Narrative  . Not on file   Social Determinants of Health   Financial Resource Strain:   . Difficulty of Paying Living Expenses: Not on file  Food Insecurity:   . Worried About Charity fundraiser in the Last Year: Not on file  . Ran Out of Food in the Last Year: Not on file    Transportation Needs:   . Lack of Transportation (Medical): Not on file  . Lack of Transportation (Non-Medical): Not on file  Physical Activity:   . Days of Exercise per Week: Not on file  . Minutes of Exercise per Session: Not on file  Stress:   . Feeling of Stress : Not on file  Social Connections:   . Frequency of Communication with Friends and Family: Not on file  . Frequency of Social Gatherings with Friends and Family: Not on file  . Attends Religious Services: Not on file  . Active Member of Clubs or Organizations: Not on file  . Attends Archivist Meetings: Not on file  . Marital Status: Not on file  Intimate Partner Violence:   . Fear of Current or Ex-Partner: Not on file  . Emotionally Abused: Not on file  . Physically Abused: Not on file  . Sexually Abused: Not on file     PHYSICAL EXAM:  VS: Ht 5\' 10"  (1.778 m)   Wt 195 lb (88.5 kg)   BMI 27.98 kg/m  Physical Exam Gen: NAD, alert, cooperative with exam, well-appearing MSK:  Left wrist: Significant tenderness to palpation over the ulnar aspect. Swelling and redness over  the lateral aspect. Limited extension and flexion. Pain with supination pronation. Neurovascularly intact  Limited ultrasound: Left wrist:  No change of the distal ulna. Increased hyperemia over the soft tissue overlying the TFCC. No changes of the ECU or its insertion. No cobblestoning or foreign body appreciated  Summary: Findings are suggestive of an inflammatory process.  Ultrasound and interpretation by Clearance Coots, MD  1. Hand/wrist/forearm  2. Left 3. Ulnar gutter 4. Ortho-glass 5. Applied by Dr. Raeford Razor   ASSESSMENT & PLAN:   Left wrist pain Pain is been ongoing for the past few days.  Initially had his wrist and continued working out.  No bony changes appreciated on ultrasound or x-ray.  Increased hyperemia of the soft tissue overlying the TFCC.  No abscess or signs of infection.  No foreign body  appreciated. -Counseled on supportive care. -IM Depo-Medrol. -Prednisone. -May need to consider Keflex for foreign body if pain is ongoing with no improvement.

## 2019-10-29 NOTE — Assessment & Plan Note (Signed)
Pain is been ongoing for the past few days.  Initially had his wrist and continued working out.  No bony changes appreciated on ultrasound or x-ray.  Increased hyperemia of the soft tissue overlying the TFCC.  No abscess or signs of infection.  No foreign body appreciated. -Counseled on supportive care. -IM Depo-Medrol. -Prednisone. -May need to consider Keflex for foreign body if pain is ongoing with no improvement.

## 2019-10-29 NOTE — Patient Instructions (Signed)
Good to see you Please try ice  Please use the ibuprofen if needed  Please use the splint until the pain has improved   Please send me a message in MyChart with any questions or updates.  Please see me back in 2 weeks.   --Dr. Raeford Razor

## 2020-02-21 ENCOUNTER — Encounter: Payer: Self-pay | Admitting: Internal Medicine

## 2020-02-21 ENCOUNTER — Telehealth: Payer: Self-pay | Admitting: Internal Medicine

## 2020-02-21 NOTE — Telephone Encounter (Signed)
Pt called requesting prescription for his acid reflux. He stated that Omeprazole has not been working anymore.

## 2020-02-21 NOTE — Telephone Encounter (Signed)
Patient is returning your call.  

## 2020-02-21 NOTE — Telephone Encounter (Signed)
Left message for pt to call back  °

## 2020-02-24 ENCOUNTER — Other Ambulatory Visit: Payer: Self-pay

## 2020-02-24 MED ORDER — PANTOPRAZOLE SODIUM 40 MG PO TBEC: 40.0000 mg | DELAYED_RELEASE_TABLET | Freq: Two times a day (BID) | ORAL | 0 refills | Status: DC

## 2020-02-24 NOTE — Telephone Encounter (Signed)
Increase pantoprazole to 40 mg BIDAC and schedule him an APP visit (rx x 1 month until app visit)

## 2020-02-24 NOTE — Telephone Encounter (Signed)
Pt called and states he has been on protonix "forever" but he has been taking it off and on. Reports he has been having bloating, cough, and acid coming up in his throat. Pt reports he has started taking the protonix faithfully this week but reports he still feels like it isn't helping. Please advise.

## 2020-02-24 NOTE — Telephone Encounter (Signed)
Spoke with pt, new script sent to pharmacy, pt scheduled to see Alonza Bogus PA 03/20/20@2pm . Pt aware.

## 2020-03-17 ENCOUNTER — Other Ambulatory Visit: Payer: Self-pay | Admitting: Internal Medicine

## 2020-03-20 ENCOUNTER — Encounter: Payer: Self-pay | Admitting: Gastroenterology

## 2020-03-20 ENCOUNTER — Telehealth: Payer: Self-pay | Admitting: Gastroenterology

## 2020-03-20 ENCOUNTER — Ambulatory Visit (INDEPENDENT_AMBULATORY_CARE_PROVIDER_SITE_OTHER): Payer: BC Managed Care – PPO | Admitting: Gastroenterology

## 2020-03-20 VITALS — BP 160/80 | HR 70 | Ht 70.0 in | Wt 202.0 lb

## 2020-03-20 DIAGNOSIS — Z800 Family history of malignant neoplasm of digestive organs: Secondary | ICD-10-CM

## 2020-03-20 DIAGNOSIS — R140 Abdominal distension (gaseous): Secondary | ICD-10-CM | POA: Diagnosis not present

## 2020-03-20 DIAGNOSIS — K219 Gastro-esophageal reflux disease without esophagitis: Secondary | ICD-10-CM

## 2020-03-20 MED ORDER — OMEPRAZOLE 40 MG PO CPDR: 40.0000 mg | DELAYED_RELEASE_CAPSULE | Freq: Every day | ORAL | 5 refills | Status: DC

## 2020-03-20 MED ORDER — NA SULFATE-K SULFATE-MG SULF 17.5-3.13-1.6 GM/177ML PO SOLN: 1.0000 | Freq: Once | ORAL | 0 refills | Status: AC

## 2020-03-20 NOTE — Telephone Encounter (Signed)
Pt is requesting a call back from a nurse, pt states the coupon to get his solution has expired.

## 2020-03-20 NOTE — Progress Notes (Signed)
03/20/2020 Preston Manning [id number] February 06, 1970   HISTORY OF PRESENT ILLNESS: This is a 50 year old male who is a patient of Dr. Vena Rua.  He is here today to talk about his issues with GERD and abdominal bloating.  He is currently on pantoprazole 40 mg twice daily and does not feel like it helps anymore.  It was just recently increased to twice a day and he does not feel like that has made a difference.  He had been on that medication for a long time.  He also complains of generalized abdominal bloating.  He says that he gets bloated anytime that he eats, but definitely with certain foods such as milk, ice cream, broccoli, etc.  He notes that he definitely has some lactose intolerance.  He is taking a daily Culturelle probiotic.  He denies any abdominal pain.  For the most part he moves his bowels regularly.  He denies any rectal bleeding.  He had an EGD in July 2015 that showed normal mucosa in the esophagus with slightly irregular Z-line.  He did have acute gastritis.  Biopsy of the esophagus showed GE junction mucosa with mild inflammation consistent with GERD.  Gastric biopsies showed inflammation, negative for H. Pylori.  He is already scheduled for surveillance colonoscopy.  His last colonoscopy was in 2015 at which time he had one polyp removed that was not an adenomatous polyp.  It says that the surveillance colonoscopies for family history of colon cancer, but the only family member that he reports to me who had colon cancer with his maternal uncle.  Nonetheless, he is already scheduled and was instructed at his visit today.   Past Medical History:  Diagnosis Date  . GERD (gastroesophageal reflux disease)   . Helicobacter pylori (H. pylori)    Past Surgical History:  Procedure Laterality Date  . EYE SURGERY Right   . NASAL SINUS SURGERY      reports that he has never smoked. He has never used smokeless tobacco. He reports that he does not drink alcohol and does not use  drugs. family history includes Alcoholism in his father; Colon cancer in his maternal uncle; Diabetes in an other family member. No Known Allergies    Outpatient Encounter Medications as of 03/20/2020  Medication Sig  . Ascorbic Acid (VITAMIN C PO) Take 1 tablet by mouth 3 (three) times daily.   Marland Kitchen GARLIC PO Take 1 capsule by mouth 3 (three) times daily.  Marland Kitchen glucosamine-chondroitin 500-400 MG tablet Take 1 tablet by mouth 2 (two) times daily.   Marland Kitchen ibuprofen (ADVIL) 600 MG tablet Take 1 tablet (600 mg total) by mouth every 6 (six) hours as needed.  . montelukast (SINGULAIR) 10 MG tablet Take 10 mg by mouth at bedtime.  . OREGANO PO Take 1 capsule by mouth daily.  . pantoprazole (PROTONIX) 40 MG tablet Take 1 tablet (40 mg total) by mouth 2 (two) times daily before a meal.  . phenazopyridine (PYRIDIUM) 200 MG tablet Take 1 tablet (200 mg total) by mouth 3 (three) times daily.  . Probiotic Product (RESTORA) CAPS Take 1 capsule by mouth daily.  . pseudoephedrine (SUDAFED) 30 MG tablet Take 30 mg by mouth daily as needed for congestion.  . TURMERIC PO Take 1 capsule by mouth daily.  . [DISCONTINUED] nystatin (MYCOSTATIN) 100000 UNIT/ML suspension Take 5 mLs (500,000 Units total) by mouth 4 (four) times daily. Swish and swallow x 1 week  . [DISCONTINUED] pantoprazole (PROTONIX) 40 MG tablet TAKE 1 TABLET (40  MG TOTAL) BY MOUTH DAILY.  . [DISCONTINUED] predniSONE (DELTASONE) 5 MG tablet Take 6 pills for first day, 5 pills second day, 4 pills third day, 3 pills fourth day, 2 pills the fifth day, and 1 pill sixth day.  . [DISCONTINUED] ranitidine (ZANTAC) 150 MG tablet Take 1 tablet (150 mg total) by mouth 2 (two) times daily.   No facility-administered encounter medications on file as of 03/20/2020.     REVIEW OF SYSTEMS  : All other systems reviewed and negative except where noted in the History of Present Illness.   PHYSICAL EXAM: BP (!) 160/80   Pulse 70   Ht 5\' 10"  (1.778 m)   Wt 202 lb  (91.6 kg)   BMI 28.98 kg/m  General: Well developed AA male in no acute distress Head: Normocephalic and atraumatic Eyes:  Sclerae anicteric, conjunctiva pink. Ears: Normal auditory acuity Lungs: Clear throughout to auscultation; no W/R/R. Heart: Regular rate and rhythm; no M/R/G. Abdomen: Soft, non-distended.  BS present.  Non-tender. Rectal:  Will be done at the time of colonoscopy. Musculoskeletal: Symmetrical with no gross deformities  Skin: No lesions on visible extremities Extremities: No edema  Neurological: Alert oriented x 4, grossly non-focal Psychological:  Alert and cooperative. Normal mood and affect  ASSESSMENT AND PLAN: *GERD: Does not feel like pantoprazole is helping.  Will discontinue pantoprazole and change to something different.  He said that Nexium was too strong for him in the past and made him vomit.  We will try omeprazole 40 mg daily.  Prescription sent to pharmacy. *Generalized abdominal bloating: Usually occurs after he eats, definitely with certain foods.  We discussed a lactose-free diet as he knows that he is definitely lactose intolerance.  We also discussed trying a FODMAP diet.  He will try a lactose-free diet first, but he was given literature on both.  He takes Culturelle probiotic, would like to try different probiotic.  Recommended restora versus align versus Florastor.  Could consider testing for or empirically treating for SIBO with Xifaxan.  **He is already scheduled for surveillance colonoscopy.  He was instructed on that his visit today and his previsit appointment was canceled.   CC:  Maurice Small, MD

## 2020-03-20 NOTE — Patient Instructions (Addendum)
If you are age 49 or older, your body mass index should be between 23-30. Your Body mass index is 28.98 kg/m. If this is out of the aforementioned range listed, please consider follow up with your Primary Care Provider.  If you are age 32 or younger, your body mass index should be between 19-25. Your Body mass index is 28.98 kg/m. If this is out of the aformentioned range listed, please consider follow up with your Primary Care Provider.   We have sent the following medications to your pharmacy for you to pick up at your convenience: Omeprazole 40 mg daily 30-60 minutes before breakfast.   Stop Pantoprazole.  Start probiotic daily such as Restora, Clinical cytogeneticist.  Information provided on Lactose free diet and FODMAP diet.  You have been scheduled for a colonoscopy. Please follow written instructions given to you at your visit today.  Please pick up your prep supplies at the pharmacy within the next 1-3 days. If you use inhalers (even only as needed), please bring them with you on the day of your procedure.  Due to recent changes in healthcare laws, you may see the results of your imaging and laboratory studies on MyChart before your provider has had a chance to review them.  We understand that in some cases there may be results that are confusing or concerning to you. Not all laboratory results come back in the same time frame and the provider may be waiting for multiple results in order to interpret others.  Please give [place] 48 hours in order for your provider to thoroughly review all the results before contacting the office for clarification of your results.

## 2020-03-20 NOTE — Telephone Encounter (Signed)
Left message for patient to call office.  

## 2020-03-23 NOTE — Telephone Encounter (Signed)
Patient calling to follow up and return the call

## 2020-03-24 NOTE — Telephone Encounter (Signed)
Left message for patient to call office.  

## 2020-03-24 NOTE — Progress Notes (Signed)
Addendum: Reviewed and agree with assessment and management plan. Samreen Seltzer M, MD  

## 2020-04-03 ENCOUNTER — Encounter: Payer: Self-pay | Admitting: *Deleted

## 2020-04-03 MED ORDER — CLENPIQ 10-3.5-12 MG-GM -GM/160ML PO SOLN: 1.0000 | Freq: Once | ORAL | 0 refills | Status: AC

## 2020-04-03 NOTE — Telephone Encounter (Signed)
Switched prep to Clenpiq. Patient informed. Sent new instructions via mychart.

## 2020-04-17 ENCOUNTER — Other Ambulatory Visit: Payer: Self-pay

## 2020-04-17 ENCOUNTER — Encounter: Payer: Self-pay | Admitting: Internal Medicine

## 2020-04-17 ENCOUNTER — Ambulatory Visit (AMBULATORY_SURGERY_CENTER): Payer: BC Managed Care – PPO | Admitting: Internal Medicine

## 2020-04-17 VITALS — BP 154/79 | HR 66 | Temp 96.6°F | Resp 24 | Ht 70.0 in | Wt 202.0 lb

## 2020-04-17 DIAGNOSIS — Z800 Family history of malignant neoplasm of digestive organs: Secondary | ICD-10-CM | POA: Diagnosis present

## 2020-04-17 DIAGNOSIS — D124 Benign neoplasm of descending colon: Secondary | ICD-10-CM

## 2020-04-17 MED ORDER — SODIUM CHLORIDE 0.9 % IV SOLN: 500.0000 mL | Freq: Once | INTRAVENOUS | Status: DC

## 2020-04-17 NOTE — Progress Notes (Signed)
Report to PACU, RN, vss, BBS= Clear.  

## 2020-04-17 NOTE — Op Note (Signed)
[facility] Patient Name: Preston Manning Procedure Date: 04/17/2020 1:15 PM MRN: [id number] Endoscopist: Jerene Bears , MD Age: 50 Referring MD:  Date of Birth: 1970-07-06 Gender: Male Account #: [id number] Procedure:                Colonoscopy Indications:              Colon cancer screening in patient at increased                            risk: Family history of colorectal cancer in 2nd                            degree relatives, Last colonoscopy: July 2015 Medicines:                Monitored Anesthesia Care Procedure:                Pre-Anesthesia Assessment:                           - Prior to the procedure, a History and Physical                            was performed, and patient medications and                            allergies were reviewed. The patient's tolerance of                            previous anesthesia was also reviewed. The risks                            and benefits of the procedure and the sedation                            options and risks were discussed with the patient.                            All questions were answered, and informed consent                            was obtained. Prior Anticoagulants: The patient has                            taken no previous anticoagulant or antiplatelet                            agents. ASA Grade Assessment: II - A patient with                            mild systemic disease. After reviewing the risks                            and benefits, the patient was deemed in  satisfactory condition to undergo the procedure.                           After obtaining informed consent, the colonoscope                            was passed under direct vision. Throughout the                            procedure, the patient's blood pressure, pulse, and                            oxygen saturations were monitored continuously. The                            Olympus PCF-H190DL  (#[id number]) Colonoscope was                            introduced through the anus and advanced to the                            cecum, identified by appendiceal orifice and                            ileocecal valve. The colonoscopy was performed                            without difficulty. The patient tolerated the                            procedure well. The quality of the bowel                            preparation was good. The ileocecal valve,                            appendiceal orifice, and rectum were photographed. Scope In: 1:40:59 PM Scope Out: 1:58:19 PM Scope Withdrawal Time: 0 hours 15 minutes 12 seconds  Total Procedure Duration: 0 hours 17 minutes 20 seconds  Findings:                 The digital rectal exam was normal.                           A 4 mm polyp was found in the proximal descending                            colon. The polyp was sessile. The polyp was removed                            with a cold snare. Resection and retrieval were                            complete.  Internal hemorrhoids and anal skin tags were found                            during retroflexion. The hemorrhoids were small.                           The exam was otherwise without abnormality. Complications:            No immediate complications. Estimated Blood Loss:     Estimated blood loss: none. Impression:               - One 4 mm polyp in the proximal descending colon,                            removed with a cold snare. Resected and retrieved.                           - Internal hemorrhoids.                           - The examination was otherwise normal. Recommendation:           - Patient has a contact number available for                            emergencies. The signs and symptoms of potential                            delayed complications were discussed with the                            patient. Return to normal activities tomorrow.                             Written discharge instructions were provided to the                            patient.                           - Resume previous diet.                           - Continue present medications.                           - Await pathology results.                           - Repeat colonoscopy is recommended. The                            colonoscopy date will be determined after pathology                            results from today's exam become available for  review. Jerene Bears, MD 04/17/2020 2:05:45 PM This report has been signed electronically.

## 2020-04-17 NOTE — Progress Notes (Signed)
Called to room to assist during endoscopic procedure.  Patient ID and intended procedure confirmed with present staff. Received instructions for my participation in the procedure from the performing physician.  

## 2020-04-17 NOTE — Patient Instructions (Signed)
YOU HAD AN ENDOSCOPIC PROCEDURE TODAY AT THE [facility]:   Refer to the procedure report that was given to you for any specific questions about what was found during the examination.  If the procedure report does not answer your questions, please call your gastroenterologist to clarify.  If you requested that your care partner not be given the details of your procedure findings, then the procedure report has been included in a sealed envelope for you to review at your convenience later.  YOU SHOULD EXPECT: Some feelings of bloating in the abdomen. Passage of more gas than usual.  Walking can help get rid of the air that was put into your GI tract during the procedure and reduce the bloating. If you had a lower endoscopy (such as a colonoscopy or flexible sigmoidoscopy) you may notice spotting of blood in your stool or on the toilet paper. If you underwent a bowel prep for your procedure, you may not have a normal bowel movement for a few days.  Please Note:  You might notice some irritation and congestion in your nose or some drainage.  This is from the oxygen used during your procedure.  There is no need for concern and it should clear up in a day or so.  SYMPTOMS TO REPORT IMMEDIATELY:   Following lower endoscopy (colonoscopy or flexible sigmoidoscopy):  Excessive amounts of blood in the stool  Significant tenderness or worsening of abdominal pains  Swelling of the abdomen that is new, acute  Fever of 100F or higher   Following upper endoscopy (EGD)  Vomiting of blood or coffee ground material  New chest pain or pain under the shoulder blades  Painful or persistently difficult swallowing  New shortness of breath  Fever of 100F or higher  Black, tarry-looking stools  For urgent or emergent issues, a gastroenterologist can be reached at any hour by calling [phone]. Do not use MyChart messaging for urgent concerns.    DIET:  We do recommend a small meal at first, but  then you may proceed to your regular diet.  Drink plenty of fluids but you should avoid alcoholic beverages for 24 hours.  ACTIVITY:  You should plan to take it easy for the rest of today and you should NOT DRIVE or use heavy machinery until tomorrow (because of the sedation medicines used during the test).    FOLLOW UP: Our staff will call the number listed on your records 48-72 hours following your procedure to check on you and address any questions or concerns that you may have regarding the information given to you following your procedure. If we do not reach you, we will leave a message.  We will attempt to reach you two times.  During this call, we will ask if you have developed any symptoms of COVID 19. If you develop any symptoms (ie: fever, flu-like symptoms, shortness of breath, cough etc.) before then, please call [phone].  If you test positive for Covid 19 in the 2 weeks post procedure, please call and report this information to us.    If any biopsies were taken you will be contacted by phone or by letter within the next 1-3 weeks.  Please call us at [phone] if you have not heard about the biopsies in 3 weeks.    SIGNATURES/CONFIDENTIALITY: You and/or your care partner have signed paperwork which will be entered into your electronic medical record.  These signatures attest to the fact that that the information above on   your After Visit Summary has been reviewed and is understood.  Full responsibility of the confidentiality of this discharge information lies with you and/or your care-partner. 

## 2020-04-19 ENCOUNTER — Telehealth: Payer: Self-pay

## 2020-04-19 ENCOUNTER — Telehealth: Payer: Self-pay | Admitting: *Deleted

## 2020-04-19 NOTE — Telephone Encounter (Signed)
Left message on follow up call. 

## 2020-04-19 NOTE — Telephone Encounter (Signed)
  Follow up Call-  Call back number 04/17/2020  Post procedure Call Back phone  # [phone]  Permission to leave phone message Yes  Some recent data might be hidden     No answer at 2nd attempt follow up phone call.  Left message on voicemail.

## 2020-04-25 ENCOUNTER — Other Ambulatory Visit: Payer: Self-pay

## 2020-04-25 ENCOUNTER — Ambulatory Visit (HOSPITAL_COMMUNITY)
Admission: EM | Admit: 2020-04-25 | Discharge: 2020-04-25 | Disposition: A | Discharge status: Home / Self Care | Payer: BC Managed Care – PPO

## 2020-04-25 ENCOUNTER — Encounter: Payer: Self-pay | Admitting: Internal Medicine

## 2020-04-25 ENCOUNTER — Encounter (HOSPITAL_COMMUNITY): Payer: Self-pay | Admitting: Emergency Medicine

## 2020-04-25 DIAGNOSIS — L0291 Cutaneous abscess, unspecified: Secondary | ICD-10-CM | POA: Diagnosis not present

## 2020-04-25 MED ORDER — SULFAMETHOXAZOLE-TRIMETHOPRIM 800-160 MG PO TABS: 1.0000 | ORAL_TABLET | Freq: Two times a day (BID) | ORAL | 0 refills | Status: AC

## 2020-04-25 NOTE — ED Triage Notes (Signed)
Initially, patient thought there was an ingrown, swollen area on testicle.  Popped the bump on Thursday and then applied toothpaste, has applied apple cider vinegar.  Since then, wound is slightly bigger.  No pain or swelling

## 2020-04-25 NOTE — ED Provider Notes (Signed)
[facility]    CSN: [id number] Arrival date & time: 04/25/20  1402      History   Chief Complaint Chief Complaint  Patient presents with  . Abscess    HPI Preston Manning is a 50 y.o. male.   Patient presents with a painful bump on his right scrotum x5 to 6 days.  He states he was able to pop the bump and drain it on 04/20/2020.  The bump has improved greatly since then.  He also has been treating it with topical toothpaste and apple cider vinegar.  He denies fever, chills, penile discharge, testicular pain, abdominal pain, dysuria, or other symptoms.  His medical history includes H. pylori and GERD.  The history is provided by the patient and medical records.    Past Medical History:  Diagnosis Date  . GERD (gastroesophageal reflux disease)   . Helicobacter pylori (H. pylori)     Patient Active Problem List   Diagnosis Date Noted  . Bloating 03/20/2020  . Left wrist pain 10/29/2019  . GERD (gastroesophageal reflux disease) 07/29/2013  . Family history of colon cancer 07/29/2013  . History of Helicobacter pylori infection 07/29/2013    Past Surgical History:  Procedure Laterality Date  . EYE SURGERY Right   . NASAL SINUS SURGERY         Home Medications    Prior to Admission medications   Medication Sig Start Date End Date Taking? Authorizing Provider  fexofenadine (ALLEGRA) 60 MG tablet Take 60 mg by mouth 2 (two) times daily.   Yes [provider]  Probiotic Product (RESTORA) CAPS Take 1 capsule by mouth daily. 07/29/13  Yes Pyrtle, Lajuan Lines, MD  sulfamethoxazole-trimethoprim (BACTRIM DS) 800-160 MG tablet Take 1 tablet by mouth 2 (two) times daily for 7 days. 04/25/20 05/02/20 Yes Sharion Balloon, NP  Ascorbic Acid (VITAMIN C PO) Take 1 tablet by mouth 3 (three) times daily.     [provider]  GARLIC PO Take 1 capsule by mouth 3 (three) times daily. Patient not taking: Reported on 04/17/2020    [provider]  glucosamine-chondroitin  500-400 MG tablet Take 1 tablet by mouth 2 (two) times daily.  Patient not taking: Reported on 04/17/2020    [provider]  ibuprofen (ADVIL) 600 MG tablet Take 1 tablet (600 mg total) by mouth every 6 (six) hours as needed. 10/28/19   Rosemarie Ax, MD  montelukast (SINGULAIR) 10 MG tablet Take 10 mg by mouth at bedtime.    [provider]  omeprazole (PRILOSEC) 40 MG capsule Take 1 capsule (40 mg total) by mouth daily. 03/20/20   Zehr, Janett Billow D, PA-C  OREGANO PO Take 1 capsule by mouth daily. Patient not taking: Reported on 04/17/2020    [provider]  phenazopyridine (PYRIDIUM) 200 MG tablet Take 1 tablet (200 mg total) by mouth 3 (three) times daily. 05/06/16   Lysbeth Penner, FNP  pseudoephedrine (SUDAFED) 30 MG tablet Take 30 mg by mouth daily as needed for congestion.    [provider]  TURMERIC PO Take 1 capsule by mouth daily.    [provider]    Family History Family History  Problem Relation Age of Onset  . Colon cancer Maternal Uncle   . Diabetes Other        Maternal family  . Alcoholism Father        kidney issues    Social History Social History   Tobacco Use  . Smoking  status: Never Smoker  . Smokeless tobacco: Never Used  Vaping Use  . Vaping Use: Never used  Substance Use Topics  . Alcohol use: No  . Drug use: No     Allergies   Patient has no known allergies.   Review of Systems Review of Systems  Constitutional: Negative for chills and fever.  HENT: Negative for ear pain and sore throat.   Eyes: Negative for pain and visual disturbance.  Respiratory: Negative for cough and shortness of breath.   Cardiovascular: Negative for chest pain and palpitations.  Gastrointestinal: Negative for abdominal pain and vomiting.  Genitourinary: Negative for dysuria, hematuria, penile pain and testicular pain.  Musculoskeletal: Negative for arthralgias and back pain.  Skin: Positive for wound. Negative for  color change.  Neurological: Negative for seizures and syncope.  All other systems reviewed and are negative.    Physical Exam Triage Vital Signs ED Triage Vitals  Enc Vitals Group     BP      Pulse      Resp      Temp      Temp src      SpO2      Weight      Height      Head Circumference      Peak Flow      Pain Score      Pain Loc      Pain Edu?      Excl. in [place]?    No data found.  Updated Vital Signs BP (!) 155/83 (BP Location: Left Arm)   Pulse 78   Temp (!) 97.3 F (36.3 C) (Oral)   Resp 20   SpO2 97%   Visual Acuity Right Eye Distance:   Left Eye Distance:   Bilateral Distance:    Right Eye Near:   Left Eye Near:    Bilateral Near:     Physical Exam Vitals and nursing note reviewed.  Constitutional:      General: He is not in acute distress.    Appearance: He is well-developed.  HENT:     Head: Normocephalic and atraumatic.     Mouth/Throat:     Mouth: Mucous membranes are moist.  Eyes:     Conjunctiva/sclera: Conjunctivae normal.  Cardiovascular:     Rate and Rhythm: Normal rate and regular rhythm.     Heart sounds: Normal heart sounds.  Pulmonary:     Effort: Pulmonary effort is normal. No respiratory distress.     Breath sounds: Normal breath sounds.  Abdominal:     Palpations: Abdomen is soft.     Tenderness: There is no abdominal tenderness.  Genitourinary:    Penis: Normal.      Testes: Normal.  Musculoskeletal:     Cervical back: Neck supple.  Skin:    General: Skin is warm and dry.     Findings: Lesion present.     Comments: 1 cm area of induration with open healing central lesion; scant purulent drainage.  No erythema.  Neurological:     General: No focal deficit present.     Mental Status: He is alert and oriented to person, place, and time.     Gait: Gait normal.  Psychiatric:        Mood and Affect: Mood normal.        Behavior: Behavior normal.      UC Treatments / Results  Labs (all labs ordered are listed, but  only abnormal results are displayed) Labs Reviewed -  No data to display  EKG   Radiology No results found.  Procedures Procedures (including critical care time)  Medications Ordered in UC Medications - No data to display  Initial Impression / Assessment and Plan / UC Course  I have reviewed the triage vital signs and the nursing notes.  Pertinent labs & imaging results that were available during my care of the patient were reviewed by me and considered in my medical decision making (see chart for details).   Abscess on right scrotum.  No I&D indicated as area has already ruptured and drained.  Treating with Bactrim DS.  Wound care instructions and signs of worsening infection discussed.  Discussed with patient that he should stop topical home remedies and apply only an antibiotic ointment.  Instructed him to return here or follow-up with his PCP if he notes signs of worsening infection.  He agrees to plan of care.   Final Clinical Impressions(s) / UC Diagnoses   Final diagnoses:  Abscess     Discharge Instructions     Take the antibiotic as directed.  Keep your wound clean and dry.  Wash it gently twice a day with soap and water.  Apply an antibiotic cream twice a day.    Return here or follow up with your primary care provider if you see signs of infection, such as increased pain, redness, warmth, fever, chills, or other concerning symptoms.         ED Prescriptions    Medication Sig Dispense Auth. Provider   sulfamethoxazole-trimethoprim (BACTRIM DS) 800-160 MG tablet Take 1 tablet by mouth 2 (two) times daily for 7 days. 14 tablet Sharion Balloon, NP     PDMP not reviewed this encounter.   Sharion Balloon, NP 04/25/20 [phone]

## 2020-04-25 NOTE — Discharge Instructions (Signed)
Take the antibiotic as directed.  Keep your wound clean and dry.  Wash it gently twice a day with soap and water.  Apply an antibiotic cream twice a day.    Return here or follow up with your primary care provider if you see signs of infection, such as increased pain, redness, warmth, fever, chills, or other concerning symptoms.

## 2020-05-08 ENCOUNTER — Other Ambulatory Visit: Payer: Self-pay | Admitting: Internal Medicine

## 2020-11-10 ENCOUNTER — Other Ambulatory Visit: Payer: Self-pay | Admitting: Gastroenterology

## 2021-04-03 DIAGNOSIS — M542 Cervicalgia: Secondary | ICD-10-CM | POA: Diagnosis not present

## 2021-04-23 DIAGNOSIS — M542 Cervicalgia: Secondary | ICD-10-CM | POA: Diagnosis not present

## 2021-04-23 DIAGNOSIS — M25512 Pain in left shoulder: Secondary | ICD-10-CM | POA: Diagnosis not present

## 2021-04-30 DIAGNOSIS — M25512 Pain in left shoulder: Secondary | ICD-10-CM | POA: Diagnosis not present

## 2021-04-30 DIAGNOSIS — M542 Cervicalgia: Secondary | ICD-10-CM | POA: Diagnosis not present

## 2021-05-31 DIAGNOSIS — M791 Myalgia, unspecified site: Secondary | ICD-10-CM | POA: Diagnosis not present

## 2021-05-31 DIAGNOSIS — M25512 Pain in left shoulder: Secondary | ICD-10-CM | POA: Diagnosis not present

## 2021-05-31 DIAGNOSIS — R12 Heartburn: Secondary | ICD-10-CM | POA: Diagnosis not present

## 2021-06-19 DIAGNOSIS — M25512 Pain in left shoulder: Secondary | ICD-10-CM | POA: Diagnosis not present

## 2021-06-19 DIAGNOSIS — M25522 Pain in left elbow: Secondary | ICD-10-CM | POA: Diagnosis not present

## 2021-11-19 DIAGNOSIS — Z0000 Encounter for general adult medical examination without abnormal findings: Secondary | ICD-10-CM | POA: Diagnosis not present

## 2021-11-19 DIAGNOSIS — Z125 Encounter for screening for malignant neoplasm of prostate: Secondary | ICD-10-CM | POA: Diagnosis not present

## 2021-11-19 DIAGNOSIS — E785 Hyperlipidemia, unspecified: Secondary | ICD-10-CM | POA: Diagnosis not present

## 2021-11-19 DIAGNOSIS — Z5181 Encounter for therapeutic drug level monitoring: Secondary | ICD-10-CM | POA: Diagnosis not present

## 2021-11-19 DIAGNOSIS — Z23 Encounter for immunization: Secondary | ICD-10-CM | POA: Diagnosis not present

## 2021-11-19 DIAGNOSIS — R7303 Prediabetes: Secondary | ICD-10-CM | POA: Diagnosis not present

## 2022-03-05 ENCOUNTER — Ambulatory Visit
Admission: RE | Admit: 2022-03-05 | Discharge: 2022-03-05 | Disposition: A | Discharge status: Home / Self Care | Payer: BC Managed Care – PPO | Source: Ambulatory Visit | Attending: Family Medicine | Admitting: Family Medicine

## 2022-03-05 ENCOUNTER — Other Ambulatory Visit: Payer: Self-pay | Admitting: Family Medicine

## 2022-03-05 DIAGNOSIS — M79675 Pain in left toe(s): Secondary | ICD-10-CM | POA: Diagnosis not present

## 2022-03-05 DIAGNOSIS — S92512A Displaced fracture of proximal phalanx of left lesser toe(s), initial encounter for closed fracture: Secondary | ICD-10-CM | POA: Diagnosis not present

## 2022-03-05 DIAGNOSIS — M79672 Pain in left foot: Secondary | ICD-10-CM

## 2022-03-05 DIAGNOSIS — M545 Low back pain, unspecified: Secondary | ICD-10-CM | POA: Diagnosis not present

## 2022-03-05 DIAGNOSIS — M5440 Lumbago with sciatica, unspecified side: Secondary | ICD-10-CM

## 2022-03-05 DIAGNOSIS — W19XXXA Unspecified fall, initial encounter: Secondary | ICD-10-CM

## 2022-03-05 DIAGNOSIS — M47817 Spondylosis without myelopathy or radiculopathy, lumbosacral region: Secondary | ICD-10-CM | POA: Diagnosis not present

## 2022-03-05 DIAGNOSIS — S2241XA Multiple fractures of ribs, right side, initial encounter for closed fracture: Secondary | ICD-10-CM | POA: Diagnosis not present

## 2022-03-13 ENCOUNTER — Encounter: Payer: Self-pay | Admitting: Podiatry

## 2022-03-13 ENCOUNTER — Ambulatory Visit (INDEPENDENT_AMBULATORY_CARE_PROVIDER_SITE_OTHER): Payer: BC Managed Care – PPO

## 2022-03-13 ENCOUNTER — Ambulatory Visit (INDEPENDENT_AMBULATORY_CARE_PROVIDER_SITE_OTHER): Payer: BC Managed Care – PPO | Admitting: Podiatry

## 2022-03-13 DIAGNOSIS — S99922A Unspecified injury of left foot, initial encounter: Secondary | ICD-10-CM

## 2022-03-13 DIAGNOSIS — S92512A Displaced fracture of proximal phalanx of left lesser toe(s), initial encounter for closed fracture: Secondary | ICD-10-CM | POA: Diagnosis not present

## 2022-03-13 NOTE — Progress Notes (Signed)
Subjective:   Patient ID: Gwenette Greet, male   DOB: 52 y.o.   MRN: [id number]   HPI Patient states he injured his left fifth toe 3 weeks ago and it has been very tender and hard to wear his normal shoes.  States that he did have trauma to it and is not sure what may have happened and patient does not currently smoke likes to be active   Review of Systems  All other systems reviewed and are negative.       Objective:  Physical Exam Vitals and nursing note reviewed.  Constitutional:      Appearance: He is well-developed.  Pulmonary:     Effort: Pulmonary effort is normal.  Musculoskeletal:        General: Normal range of motion.  Skin:    General: Skin is warm.  Neurological:     Mental Status: He is alert.     Neurovascular status was found to be intact muscle strength found to be adequate range of motion adequate with a swollen left fifth digit with fluid buildup around the proximal joint surface pain to pressure no indications of other pathology.  Good digital perfusion well-oriented x 3     Assessment:  Possibility for fracture of the left fifth digit versus contusion     Plan:  H&P today x-rays reviewed and recommended buddy splinting the toes discussed the fracture and the fact it is displaced I do think it will heal but it is good to be an 8 to 12-week process and dispensed a toe guard to try to take pressure and allow him to continue wearing surgical shoe.  Reappoint to recheck if symptoms are persistent over the next 6 to 8 weeks  X-rays indicate severe fracture of the proximal phalanx left fifth toe with moderate displacement

## 2022-05-06 ENCOUNTER — Encounter: Payer: Self-pay | Admitting: *Deleted

## 2022-06-15 ENCOUNTER — Ambulatory Visit (HOSPITAL_COMMUNITY)
Admission: EM | Admit: 2022-06-15 | Discharge: 2022-06-15 | Disposition: A | Discharge status: Home / Self Care | Payer: BC Managed Care – PPO | Attending: Emergency Medicine | Admitting: Emergency Medicine

## 2022-06-15 ENCOUNTER — Ambulatory Visit (INDEPENDENT_AMBULATORY_CARE_PROVIDER_SITE_OTHER): Payer: BC Managed Care – PPO

## 2022-06-15 ENCOUNTER — Encounter (HOSPITAL_COMMUNITY): Payer: Self-pay

## 2022-06-15 DIAGNOSIS — R300 Dysuria: Secondary | ICD-10-CM

## 2022-06-15 DIAGNOSIS — Z113 Encounter for screening for infections with a predominantly sexual mode of transmission: Secondary | ICD-10-CM | POA: Insufficient documentation

## 2022-06-15 DIAGNOSIS — S92355D Nondisplaced fracture of fifth metatarsal bone, left foot, subsequent encounter for fracture with routine healing: Secondary | ICD-10-CM | POA: Insufficient documentation

## 2022-06-15 DIAGNOSIS — R600 Localized edema: Secondary | ICD-10-CM | POA: Diagnosis not present

## 2022-06-15 LAB — POCT URINALYSIS DIP (MANUAL ENTRY)
Bilirubin, UA: NEGATIVE
Glucose, UA: NEGATIVE mg/dL
Leukocytes, UA: NEGATIVE
Nitrite, UA: NEGATIVE
Protein Ur, POC: 30 mg/dL — AB
Spec Grav, UA: >=1.03 — AB (ref 1.010–1.025)
Urobilinogen, UA: 0.2 E.U./dL
pH, UA: 5.5 (ref 5.0–8.0)

## 2022-06-15 NOTE — ED Provider Notes (Signed)
[facility]    CSN: [id number] Arrival date & time: 06/15/22  1211      History   Chief Complaint Chief Complaint  Patient presents with   Toe Pain   Urinary Tract Infection    HPI Preston Manning is a 52 y.o. male.   Patient presents to clinic over concern of left pinky toe pain and cloudy urine.  Reports his left pinky toe was broken in February.  He buddy taped it and follow-up with an orthopedic, who told him that there was nothing they could do for him.  He noticed that last night at work he was having pain in the same toe and so much pain he had to take off his shoe.  Presents to clinic with the pinky toe buddy taped today.   Patient reports that since last night he noticed his urine has been cloudy and he has a stickiness around the outside of his genitals.  He does hold his urine a lot as a [employer].  Recently had screening for sexually transmitted infections about a month ago and everything was negative.  He is sexually active with his monogamous partner, uses condoms.  Denies fever, flank pain or dysuria. No lesions or sores.    The history is provided by the patient and medical records.  Toe Pain Pertinent negatives include no abdominal pain.  Urinary Tract Infection Presenting symptoms: no dysuria   Associated symptoms: no abdominal pain, no fever, no flank pain and no scrotal swelling     Past Medical History:  Diagnosis Date   GERD (gastroesophageal reflux disease)    Helicobacter pylori (H. pylori)     Patient Active Problem List   Diagnosis Date Noted   Bloating 03/20/2020   Left wrist pain 10/29/2019   GERD (gastroesophageal reflux disease) 07/29/2013   Family history of colon cancer 07/29/2013   History of Helicobacter pylori infection 07/29/2013    Past Surgical History:  Procedure Laterality Date   EYE SURGERY Right    NASAL SINUS SURGERY         Home Medications    Prior to Admission medications   Medication Sig Start Date  End Date Taking? Authorizing Provider  Ascorbic Acid (VITAMIN C PO) Take 1 tablet by mouth 3 (three) times daily.    Yes [provider]  fexofenadine (ALLEGRA) 60 MG tablet Take 60 mg by mouth 2 (two) times daily.   Yes [provider]  glucosamine-chondroitin 500-400 MG tablet Take 1 tablet by mouth 2 (two) times daily.   Yes [provider]  phenazopyridine (PYRIDIUM) 200 MG tablet Take 1 tablet (200 mg total) by mouth 3 (three) times daily. 05/06/16  Yes Deatra Canter, FNP  GARLIC PO Take 1 capsule by mouth 3 (three) times daily. Patient not taking: Reported on 04/17/2020    [provider]  ibuprofen (ADVIL) 600 MG tablet Take 1 tablet (600 mg total) by mouth every 6 (six) hours as needed. 10/28/19   Myra Rude, MD  montelukast (SINGULAIR) 10 MG tablet Take 10 mg by mouth at bedtime.    [provider]  omeprazole (PRILOSEC) 40 MG capsule TAKE 1 CAPSULE (40 MG TOTAL) BY MOUTH DAILY. 11/10/20   Zehr, Shanda Bumps D, PA-C  OREGANO PO Take 1 capsule by mouth daily. Patient not taking: Reported on 04/17/2020    [provider]  Probiotic Product (RESTORA) CAPS Take 1 capsule by mouth daily. 07/29/13   Pyrtle, Carie Caddy, MD  pseudoephedrine (SUDAFED)  30 MG tablet Take 30 mg by mouth daily as needed for congestion.    [provider]  TURMERIC PO Take 1 capsule by mouth daily.    [provider]    Family History Family History  Problem Relation Age of Onset   Colon cancer Maternal Uncle    Diabetes Other        Maternal family   Alcoholism Father        kidney issues    Social History Social History   Tobacco Use   Smoking status: Never   Smokeless tobacco: Never  Vaping Use   Vaping Use: Never used  Substance Use Topics   Alcohol use: No   Drug use: No     Allergies   Patient has no known allergies.   Review of Systems Review of Systems  Constitutional:  Negative for fever.  Gastrointestinal:   Negative for abdominal pain.  Genitourinary:  Negative for dysuria, flank pain, scrotal swelling and testicular pain.     Physical Exam Triage Vital Signs ED Triage Vitals  Enc Vitals Group     BP 06/15/22 1225 (!) 159/102     Pulse Rate 06/15/22 1225 71     Resp 06/15/22 1225 18     Temp 06/15/22 1225 98 F (36.7 C)     Temp src --      SpO2 06/15/22 1225 97 %     Weight --      Height --      Head Circumference --      Peak Flow --      Pain Score 06/15/22 1224 2     Pain Loc --      Pain Edu? --      Excl. in GC? --    No data found.  Updated Vital Signs BP (!) 159/102 (BP Location: Left Arm)   Pulse 71   Temp 98 F (36.7 C)   Resp 18   SpO2 97%   Visual Acuity Right Eye Distance:   Left Eye Distance:   Bilateral Distance:    Right Eye Near:   Left Eye Near:    Bilateral Near:     Physical Exam Vitals and nursing note reviewed.  Constitutional:      Appearance: Normal appearance.  HENT:     Head: Normocephalic and atraumatic.     Right Ear: External ear normal.     Left Ear: External ear normal.     Nose: Nose normal.     Mouth/Throat:     Mouth: Mucous membranes are moist.  Eyes:     Conjunctiva/sclera: Conjunctivae normal.  Cardiovascular:     Rate and Rhythm: Normal rate.  Pulmonary:     Effort: Pulmonary effort is normal. No respiratory distress.  Musculoskeletal:        General: Tenderness and signs of injury present. No swelling or deformity. Normal range of motion.     Cervical back: Normal range of motion.  Neurological:     General: No focal deficit present.     Mental Status: He is alert and oriented to person, place, and time.  Psychiatric:        Mood and Affect: Mood normal.        Behavior: Behavior normal.      UC Treatments / Results  Labs (all labs ordered are listed, but only abnormal results are displayed) Labs Reviewed  POCT URINALYSIS DIP (MANUAL ENTRY) - Abnormal; Notable for the following components:  Result  Value   Ketones, POC UA small (15) (*)    Spec Grav, UA >=1.030 (*)    Blood, UA trace-intact (*)    Protein Ur, POC =30 (*)    All other components within normal limits  URINE CULTURE  CYTOLOGY, (ORAL, ANAL, URETHRAL) ANCILLARY ONLY    EKG   Radiology DG Toe 5th Left  Result Date: 06/15/2022 CLINICAL DATA:  Prior fracture, pain EXAM: DG TOE 5TH LEFT COMPARISON:  03/13/2022 FINDINGS: Redemonstrated oblique fracture of the left fifth proximal phalanx, which is partially callused subacute appearing. No new fracture. Soft tissue edema about the digit. IMPRESSION: Redemonstrated oblique fracture of the left fifth proximal phalanx, which is partially callused subacute appearing. No new fracture. Electronically Signed   By: Jearld Lesch M.D.   On: 06/15/2022 13:47    Procedures Procedures (including critical care time)  Medications Ordered in UC Medications - No data to display  Initial Impression / Assessment and Plan / UC Course  I have reviewed the triage vital signs and the nursing notes.  Pertinent labs & imaging results that were available during my care of the patient were reviewed by me and considered in my medical decision making (see chart for details).  Vitals and triage reviewed, patient is hemodynamically stable.  Urinalysis in clinic showed ketones, high specific gravity, trace red blood cells and protein, encouraged to increase hydration.  Will send for culture.  Cytology swab obtained.  Should get UA rechecked with PCP within the next few weeks to a month.  Patient would like repeat imaging of left pinky toe to assess healing.  Imaging shows healing fracture to left pinky toe.  Advised buddy taping and orthopedic follow-up.  Plan of care, follow-up care and return precautions discussed, no questions at this time.     Final Clinical Impressions(s) / UC Diagnoses   Final diagnoses:  Dysuria  Screening examination for sexually transmitted disease  Closed nondisplaced  fracture of fifth metatarsal bone of left foot with routine healing, subsequent encounter     Discharge Instructions      Your pinky toe appears to be healing normally.  Please continue with buddy taping, you can alternate between 650 mg Tylenol and 800 mg ibuprofen as needed for pain and discomfort every 4-6 hours.  If your pain persist, I suggest following up with the orthopedic they are already established with, or he can go to [facility].  Your urine did not show any signs of infection.  It did look like you are dehydrated, please ensure you are drinking at least 64 ounces of water daily.  We have screened you for some sexually transmitted infections today, we will contact you if anything results is positive.  Please ensure you are urinating when you feel the urge to void.  You may return to clinic or follow-up with your primary care for any new or concerning symptoms.      ED Prescriptions   None    PDMP not reviewed this encounter.   Redding Cloe, [place], [place] 06/15/22 1357

## 2022-06-15 NOTE — Discharge Instructions (Addendum)
Your pinky toe appears to be healing normally.  Please continue with buddy taping, you can alternate between 650 mg Tylenol and 800 mg ibuprofen as needed for pain and discomfort every 4-6 hours.  If your pain persist, I suggest following up with the orthopedic they are already established with, or he can go to [facility].  Your urine did not show any signs of infection.  It did look like you are dehydrated, please ensure you are drinking at least 64 ounces of water daily.  We have screened you for some sexually transmitted infections today, we will contact you if anything results is positive.  Please ensure you are urinating when you feel the urge to void.  You may return to clinic or follow-up with your primary care for any new or concerning symptoms.

## 2022-06-15 NOTE — ED Triage Notes (Addendum)
Left toe was broken in February. Last night at work was having pain in the same toe. Pinky toe on the left foot. No falls or new injuries.   Onset last night Patient had cloudy urine and "Stickiness" around the outside of the genitalia. 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

## 2022-06-16 LAB — URINE CULTURE: Culture: NO GROWTH

## 2022-06-18 LAB — CYTOLOGY, (ORAL, ANAL, URETHRAL) ANCILLARY ONLY
Chlamydia: NEGATIVE
Comment: NEGATIVE
Comment: NEGATIVE
Comment: NORMAL
Neisseria Gonorrhea: NEGATIVE
Trichomonas: NEGATIVE

## 2022-06-21 DIAGNOSIS — R369 Urethral discharge, unspecified: Secondary | ICD-10-CM | POA: Diagnosis not present

## 2022-06-21 DIAGNOSIS — N419 Inflammatory disease of prostate, unspecified: Secondary | ICD-10-CM | POA: Diagnosis not present

## 2022-07-05 DIAGNOSIS — R808 Other proteinuria: Secondary | ICD-10-CM | POA: Diagnosis not present

## 2022-07-05 DIAGNOSIS — Z8719 Personal history of other diseases of the digestive system: Secondary | ICD-10-CM | POA: Diagnosis not present

## 2022-07-05 DIAGNOSIS — R369 Urethral discharge, unspecified: Secondary | ICD-10-CM | POA: Diagnosis not present

## 2022-07-05 DIAGNOSIS — K5909 Other constipation: Secondary | ICD-10-CM | POA: Diagnosis not present

## 2022-07-10 ENCOUNTER — Ambulatory Visit (HOSPITAL_COMMUNITY)
Admission: EM | Admit: 2022-07-10 | Discharge: 2022-07-10 | Disposition: A | Discharge status: Home / Self Care | Payer: BC Managed Care – PPO | Attending: Emergency Medicine | Admitting: Emergency Medicine

## 2022-07-10 ENCOUNTER — Encounter (HOSPITAL_COMMUNITY): Payer: Self-pay | Admitting: Emergency Medicine

## 2022-07-10 DIAGNOSIS — R369 Urethral discharge, unspecified: Secondary | ICD-10-CM | POA: Insufficient documentation

## 2022-07-10 LAB — POCT URINALYSIS DIP (MANUAL ENTRY)
Bilirubin, UA: NEGATIVE
Blood, UA: NEGATIVE
Glucose, UA: NEGATIVE mg/dL
Ketones, POC UA: NEGATIVE mg/dL
Leukocytes, UA: NEGATIVE
Nitrite, UA: NEGATIVE
Protein Ur, POC: 30 mg/dL — AB
Spec Grav, UA: >=1.03 — AB (ref 1.010–1.025)
Urobilinogen, UA: 0.2 E.U./dL
pH, UA: 6 (ref 5.0–8.0)

## 2022-07-10 MED ORDER — AZITHROMYCIN 250 MG PO TABS
1000.0000 mg | ORAL_TABLET | Freq: Once | ORAL | Status: AC
  Administered 2022-07-10: 1000 mg via ORAL

## 2022-07-10 MED ORDER — AZITHROMYCIN 250 MG PO TABS
ORAL_TABLET | ORAL | Status: AC
  Filled 2022-07-10: qty 4

## 2022-07-10 MED ORDER — LIDOCAINE HCL (PF) 1 % IJ SOLN
INTRAMUSCULAR | Status: AC
  Filled 2022-07-10: qty 2

## 2022-07-10 MED ORDER — CEFTRIAXONE SODIUM 500 MG IJ SOLR
500.0000 mg | INTRAMUSCULAR | Status: DC
  Administered 2022-07-10: 500 mg via INTRAMUSCULAR

## 2022-07-10 MED ORDER — CEFTRIAXONE SODIUM 500 MG IJ SOLR
INTRAMUSCULAR | Status: AC
  Filled 2022-07-10: qty 500

## 2022-07-10 NOTE — Discharge Instructions (Addendum)
We covered you for both gonorrhea and chlamydia today in clinic.  We will contact you if any of your lab work results is abnormal.  Due to your continued penile discharge, advise following up with urology if it persist despite antibiotic use.  Please ensure you are drinking at least 64 ounces of water daily, and urinating when you feel the urge.  Please return to clinic or follow-up with your primary care for any new or concerning symptoms.

## 2022-07-10 NOTE — ED Provider Notes (Signed)
[facility]    CSN: [id number] Arrival date & time: 07/10/22  7829      History   Chief Complaint Chief Complaint  Patient presents with   Penile Discharge    HPI Preston Manning is a 52 y.o. male.   Patient reports a penile discharge since the end of May.  He was seen here on May 25, tested negative for all STIs and urinalysis was negative for urinary tract infection.  He was not given any medications at this time.  He was seen at another urgent care on 5/31 and diagnosed with prostatitis, covered with Bactrim for 14 days.  Reports penile discharge that is green to yellow that turned milky and clear after taking Bactrim.  Has been having ongoing right groin and suprapubic discomfort.  Denies fevers, nausea, vomiting or penile lesions.  Has not been sexually active since his last visit here at the end of May.    The history is provided by the patient and medical records.  Penile Discharge    Past Medical History:  Diagnosis Date   GERD (gastroesophageal reflux disease)    Helicobacter pylori (H. pylori)     Patient Active Problem List   Diagnosis Date Noted   Bloating 03/20/2020   Left wrist pain 10/29/2019   GERD (gastroesophageal reflux disease) 07/29/2013   Family history of colon cancer 07/29/2013   History of Helicobacter pylori infection 07/29/2013    Past Surgical History:  Procedure Laterality Date   EYE SURGERY Right    NASAL SINUS SURGERY         Home Medications    Prior to Admission medications   Medication Sig Start Date End Date Taking? Authorizing Provider  Ascorbic Acid (VITAMIN C PO) Take 1 tablet by mouth 3 (three) times daily.     [provider]  fexofenadine (ALLEGRA) 60 MG tablet Take 60 mg by mouth 2 (two) times daily.    [provider]  GARLIC PO Take 1 capsule by mouth 3 (three) times daily. Patient not taking: Reported on 04/17/2020    [provider]  glucosamine-chondroitin 500-400 MG tablet  Take 1 tablet by mouth 2 (two) times daily.    [provider]  ibuprofen (ADVIL) 600 MG tablet Take 1 tablet (600 mg total) by mouth every 6 (six) hours as needed. 10/28/19   Myra Rude, MD  montelukast (SINGULAIR) 10 MG tablet Take 10 mg by mouth at bedtime.    [provider]  omeprazole (PRILOSEC) 40 MG capsule TAKE 1 CAPSULE (40 MG TOTAL) BY MOUTH DAILY. 11/10/20   Zehr, Shanda Bumps D, PA-C  OREGANO PO Take 1 capsule by mouth daily. Patient not taking: Reported on 04/17/2020    [provider]  phenazopyridine (PYRIDIUM) 200 MG tablet Take 1 tablet (200 mg total) by mouth 3 (three) times daily. 05/06/16   Deatra Canter, FNP  Probiotic Product (RESTORA) CAPS Take 1 capsule by mouth daily. 07/29/13   Pyrtle, Carie Caddy, MD  pseudoephedrine (SUDAFED) 30 MG tablet Take 30 mg by mouth daily as needed for congestion.    [provider]  TURMERIC PO Take 1 capsule by mouth daily.    [provider]    Family History Family History  Problem Relation Age of Onset   Colon cancer Maternal Uncle    Diabetes Other        Maternal family   Alcoholism Father        kidney issues    Social  History Social History   Tobacco Use   Smoking status: Never   Smokeless tobacco: Never  Vaping Use   Vaping Use: Never used  Substance Use Topics   Alcohol use: No   Drug use: No     Allergies   Patient has no known allergies.   Review of Systems Review of Systems  Constitutional:  Negative for fever.  Gastrointestinal:  Negative for nausea and vomiting.  Genitourinary:  Positive for dysuria and penile discharge.     Physical Exam Triage Vital Signs ED Triage Vitals  Enc Vitals Group     BP 07/10/22 0924 (!) 154/98     Pulse Rate 07/10/22 0924 77     Resp 07/10/22 0924 17     Temp 07/10/22 0924 98 F (36.7 C)     Temp Source 07/10/22 0924 Oral     SpO2 07/10/22 0924 98 %     Weight --      Height --      Head Circumference --      Peak  Flow --      Pain Score 07/10/22 0925 2     Pain Loc --      Pain Edu? --      Excl. in GC? --    No data found.  Updated Vital Signs BP (!) 154/98 (BP Location: Left Arm)   Pulse 77   Temp 98 F (36.7 C) (Oral)   Resp 17   SpO2 98%   Visual Acuity Right Eye Distance:   Left Eye Distance:   Bilateral Distance:    Right Eye Near:   Left Eye Near:    Bilateral Near:     Physical Exam Vitals and nursing note reviewed.  Constitutional:      Appearance: Normal appearance.  HENT:     Head: Normocephalic and atraumatic.     Right Ear: External ear normal.     Left Ear: External ear normal.     Nose: Nose normal.     Mouth/Throat:     Mouth: Mucous membranes are moist.  Eyes:     Conjunctiva/sclera: Conjunctivae normal.  Cardiovascular:     Rate and Rhythm: Normal rate.  Pulmonary:     Effort: Pulmonary effort is normal. No respiratory distress.  Musculoskeletal:        General: No swelling. Normal range of motion.  Skin:    General: Skin is warm.  Neurological:     General: No focal deficit present.     Mental Status: He is alert.  Psychiatric:        Mood and Affect: Mood normal.        Behavior: Behavior is cooperative.      UC Treatments / Results  Labs (all labs ordered are listed, but only abnormal results are displayed) Labs Reviewed  POCT URINALYSIS DIP (MANUAL ENTRY) - Abnormal; Notable for the following components:      Result Value   Spec Grav, UA >=1.030 (*)    Protein Ur, POC =30 (*)    All other components within normal limits  RPR  CYTOLOGY, (ORAL, ANAL, URETHRAL) ANCILLARY ONLY    EKG   Radiology No results found.  Procedures Procedures (including critical care time)  Medications Ordered in UC Medications  cefTRIAXone (ROCEPHIN) injection 500 mg (has no administration in time range)  azithromycin (ZITHROMAX) tablet 1,000 mg (has no administration in time range)    Initial Impression / Assessment and Plan / UC Course  I have  reviewed the triage vital signs and the nursing notes.  Pertinent labs & imaging results that were available during my care of the patient were reviewed by me and considered in my medical decision making (see chart for details).  Vitals and triage reviewed, patient is hemodynamically stable.  Urinalysis significant for protein, negative for nitrates, leukocytes or white blood cells.  Unclear etiology to persistent urethral discharge, patient has been covered for prostatitis.  Negative cytology screening at the end of May, will repeat today.  Patient had negative HIV screening earlier in the year, would like to repeat syphilis only.  Discussed covering empirically for gonorrhea and chlamydia, as may be the previous sample was inadequate.  Encouraged to follow-up with urology for continued discharge.  Plan of care, follow-up care and return precautions given, no questions at this time.     Final Clinical Impressions(s) / UC Diagnoses   Final diagnoses:  Urethral discharge in male     Discharge Instructions      We covered you for both gonorrhea and chlamydia today in clinic.  We will contact you if any of your lab work results is abnormal.  Due to your continued penile discharge, advise following up with urology if it persist despite antibiotic use.  Please ensure you are drinking at least 64 ounces of water daily, and urinating when you feel the urge.  Please return to clinic or follow-up with your primary care for any new or concerning symptoms.      ED Prescriptions   None    PDMP not reviewed this encounter.   Rinaldo Ratel [place], [place] 07/10/22 [phone]

## 2022-07-10 NOTE — ED Triage Notes (Addendum)
Pt reports had penile "drip" since May 23. Was seen here 5/25 for STI and UTI. Then seen at [facility] 5/31 bc still having symptoms but wasn't put on any medications. Was given medications for UTI. On 6/6 saw PCP who didn't do any STI testing and told to come back if still persistent. Pt was unable to get in with them today or tomorrow.  Pt reports the discharge is green to yellowish. After taking medication for UTI, the discharge turned "milky and clear".   Pt adds that he also having right groin and anal pain since 5/23. Reports was told has hemorrhoids.

## 2022-07-11 LAB — CYTOLOGY, (ORAL, ANAL, URETHRAL) ANCILLARY ONLY
Chlamydia: NEGATIVE
Comment: NEGATIVE
Comment: NEGATIVE
Comment: NORMAL
Neisseria Gonorrhea: NEGATIVE
Trichomonas: NEGATIVE

## 2022-07-11 LAB — RPR: RPR Ser Ql: NONREACTIVE

## 2022-08-29 DIAGNOSIS — M545 Low back pain, unspecified: Secondary | ICD-10-CM | POA: Diagnosis not present

## 2022-08-29 DIAGNOSIS — L209 Atopic dermatitis, unspecified: Secondary | ICD-10-CM | POA: Diagnosis not present

## 2022-08-29 DIAGNOSIS — H6993 Unspecified Eustachian tube disorder, bilateral: Secondary | ICD-10-CM | POA: Diagnosis not present

## 2022-08-29 DIAGNOSIS — J0100 Acute maxillary sinusitis, unspecified: Secondary | ICD-10-CM | POA: Diagnosis not present

## 2022-10-11 DIAGNOSIS — K594 Anal spasm: Secondary | ICD-10-CM | POA: Diagnosis not present

## 2022-10-17 DIAGNOSIS — Z125 Encounter for screening for malignant neoplasm of prostate: Secondary | ICD-10-CM | POA: Diagnosis not present

## 2022-10-17 DIAGNOSIS — N3000 Acute cystitis without hematuria: Secondary | ICD-10-CM | POA: Diagnosis not present

## 2022-10-17 DIAGNOSIS — R3129 Other microscopic hematuria: Secondary | ICD-10-CM | POA: Diagnosis not present

## 2022-12-10 DIAGNOSIS — R3982 Chronic bladder pain: Secondary | ICD-10-CM | POA: Diagnosis not present

## 2022-12-10 DIAGNOSIS — R3912 Poor urinary stream: Secondary | ICD-10-CM | POA: Diagnosis not present

## 2022-12-10 DIAGNOSIS — R350 Frequency of micturition: Secondary | ICD-10-CM | POA: Diagnosis not present

## 2023-06-01 ENCOUNTER — Encounter (HOSPITAL_COMMUNITY): Payer: Self-pay | Admitting: Emergency Medicine

## 2023-06-01 ENCOUNTER — Ambulatory Visit (HOSPITAL_COMMUNITY)
Admission: EM | Admit: 2023-06-01 | Discharge: 2023-06-01 | Disposition: A | Discharge status: Home / Self Care | Attending: Neurology | Admitting: Neurology

## 2023-06-01 DIAGNOSIS — K047 Periapical abscess without sinus: Secondary | ICD-10-CM | POA: Diagnosis not present

## 2023-06-01 MED ORDER — DICLOFENAC SODIUM 75 MG PO TBEC: 75.0000 mg | DELAYED_RELEASE_TABLET | Freq: Two times a day (BID) | ORAL | 0 refills | Status: AC

## 2023-06-01 MED ORDER — CLINDAMYCIN HCL 300 MG PO CAPS: 300.0000 mg | ORAL_CAPSULE | Freq: Three times a day (TID) | ORAL | 0 refills | Status: AC

## 2023-06-01 MED ORDER — CLINDAMYCIN HCL 150 MG PO CAPS: 150.0000 mg | ORAL_CAPSULE | Freq: Three times a day (TID) | ORAL | 0 refills | Status: AC

## 2023-06-01 NOTE — ED Provider Notes (Signed)
 [facility]    CSN: [id number] Arrival date & time: 06/01/23  1319      History   Chief Complaint Chief Complaint  Patient presents with   Oral Swelling    HPI Preston Manning is a 53 y.o. male.   Reports left facial swelling for approximately 8 weeks.  He did go to the dentist and was found to have a cracked tooth.  He did have a root canal that did not help the swelling and ended up getting that tooth pulled.  He had an x-ray done about 2 weeks ago but has not heard back regarding the results.  He was given Augmentin  and then amoxicillin  which he finished last Friday.  He is currently using ice and heat for the swelling. He did have an xray done by his PCP 2 weeks ago but I am unable to see the results. He has not yet follow up with his PCP for the results.      The history is provided by the patient.       Past Medical History:  Diagnosis Date   GERD (gastroesophageal reflux disease)    Helicobacter pylori (H. pylori)     Patient Active Problem List   Diagnosis Date Noted   Bloating 03/20/2020   Left wrist pain 10/29/2019   GERD (gastroesophageal reflux disease) 07/29/2013   Family history of colon cancer 07/29/2013   History of Helicobacter pylori infection 07/29/2013    Past Surgical History:  Procedure Laterality Date   EYE SURGERY Right    NASAL SINUS SURGERY         Home Medications    Prior to Admission medications   Medication Sig Start Date End Date Taking? Authorizing Provider  clindamycin (CLEOCIN) 150 MG capsule Take 1 capsule (150 mg total) by mouth 3 (three) times daily for 7 days. 06/01/23 06/08/23 Yes Rodgers Likes, Sim Dryer, NP  clindamycin (CLEOCIN) 300 MG capsule Take 1 capsule (300 mg total) by mouth 3 (three) times daily for 7 days. 06/01/23 06/08/23 Yes Imogene Mana, NP  diclofenac (VOLTAREN) 75 MG EC tablet Take 1 tablet (75 mg total) by mouth 2 (two) times daily for 7 days. 06/01/23 06/08/23 Yes Salik Grewell, Sim Dryer, NP  Ascorbic Acid (VITAMIN C  PO) Take 1 tablet by mouth 3 (three) times daily.     [provider]  fexofenadine (ALLEGRA) 60 MG tablet Take 60 mg by mouth 2 (two) times daily.    [provider]  GARLIC PO Take 1 capsule by mouth 3 (three) times daily. Patient not taking: Reported on 04/17/2020    [provider]  glucosamine-chondroitin 500-400 MG tablet Take 1 tablet by mouth 2 (two) times daily.    [provider]  ibuprofen  (ADVIL ) 600 MG tablet Take 1 tablet (600 mg total) by mouth every 6 (six) hours as needed. 10/28/19   Margaree Shark, MD  montelukast (SINGULAIR) 10 MG tablet Take 10 mg by mouth at bedtime.    [provider]  omeprazole  (PRILOSEC) 40 MG capsule TAKE 1 CAPSULE (40 MG TOTAL) BY MOUTH DAILY. 11/10/20   Zehr, Jessica D, PA-C  OREGANO PO Take 1 capsule by mouth daily. Patient not taking: Reported on 04/17/2020    [provider]  phenazopyridine  (PYRIDIUM ) 200 MG tablet Take 1 tablet (200 mg total) by mouth 3 (three) times daily. 05/06/16   Doc Freed, FNP  Probiotic Product (RESTORA) CAPS Take 1 capsule by mouth daily. 07/29/13   Pyrtle, Amber Bail, MD  pseudoephedrine  (SUDAFED) 30 MG tablet Take 30 mg by mouth daily as needed for congestion.    [provider]  TURMERIC PO Take 1 capsule by mouth daily.    [provider]    Family History Family History  Problem Relation Age of Onset   Colon cancer Maternal Uncle    Diabetes Other        Maternal family   Alcoholism Father        kidney issues    Social History Social History   Tobacco Use   Smoking status: Never   Smokeless tobacco: Never  Vaping Use   Vaping status: Never Used  Substance Use Topics   Alcohol use: No   Drug use: No     Allergies   Patient has no known allergies.   Review of Systems Review of Systems   Physical Exam Triage Vital Signs ED Triage Vitals [06/01/23 1328]  Encounter Vitals Group     BP      Systolic BP Percentile       Diastolic BP Percentile      Pulse      Resp      Temp      Temp src      SpO2      Weight      Height      Head Circumference      Peak Flow      Pain Score 10     Pain Loc      Pain Education      Exclude from Growth Chart    No data found.  Updated Vital Signs BP (!) 163/94 (BP Location: Right Arm)   Pulse 67   Temp 97.8 F (36.6 C) (Oral)   Resp 16   SpO2 98%   Visual Acuity Right Eye Distance:   Left Eye Distance:   Bilateral Distance:    Right Eye Near:   Left Eye Near:    Bilateral Near:     Physical Exam Vitals and nursing note reviewed.  Constitutional:      General: He is not in acute distress.    Appearance: He is well-developed.  HENT:     Head: Normocephalic and atraumatic.     Mouth/Throat:     Lips: Pink.     Mouth: Mucous membranes are moist.     Dentition: Dental caries present.     Comments: Surgical site is clean and intact. Swelling noted around jaw on the left lower aspect.  Eyes:     Conjunctiva/sclera: Conjunctivae normal.  Cardiovascular:     Rate and Rhythm: Normal rate and regular rhythm.     Heart sounds: No murmur heard. Pulmonary:     Effort: Pulmonary effort is normal. No respiratory distress.     Breath sounds: Normal breath sounds.  Abdominal:     Palpations: Abdomen is soft.     Tenderness: There is no abdominal tenderness.  Musculoskeletal:        General: No swelling.     Cervical back: Neck supple.  Skin:    General: Skin is warm and dry.     Capillary Refill: Capillary refill takes less than 2 seconds.  Neurological:     Mental Status: He is alert.  Psychiatric:        Mood and Affect: Mood normal.      UC Treatments / Results  Labs (all labs ordered are listed, but only abnormal results are displayed) Labs Reviewed - No data  to display  EKG   Radiology No results found.  Procedures Procedures (including critical care time)  Medications Ordered in UC Medications - No data to display  Initial  Impression / Assessment and Plan / UC Course  I have reviewed the triage vital signs and the nursing notes.  Pertinent labs & imaging results that were available during my care of the patient were reviewed by me and considered in my medical decision making (see chart for details).    Antibiotics ordered for dental abscess, he was nonresponsive to amoxicillin .  We will additionally recommend diclofenac 75 mg twice a day for the duration of antibiotics for pain.  He is in agreement to follow-up with his oral surgeon if his symptoms do not improve in the next 3 to 4 days and he will follow-up with his primary for the results of his x-ray.  No lab work or imaging done at this time.  Final Clinical Impressions(s) / UC Diagnoses   Final diagnoses:  Dental abscess     Discharge Instructions      Take your antibiotic 3 times a day and the pain medicine twice a day (alternate with tylenol in the afternoon).  Follow-up with your primary care provider regarding your x-ray results and follow-up with your oral surgeon if you do not start noticing improvement over the next couple of days of being on the antibiotic.  You may continue using heat and ice as needed.  You may also continue using the chlorhexidine swish from your oral surgeon.   ED Prescriptions     Medication Sig Dispense Auth. Provider   clindamycin (CLEOCIN) 300 MG capsule Take 1 capsule (300 mg total) by mouth 3 (three) times daily for 7 days. 21 capsule Dela Favor, Asha Grumbine, NP   clindamycin (CLEOCIN) 150 MG capsule Take 1 capsule (150 mg total) by mouth 3 (three) times daily for 7 days. 21 capsule Imogene Mana, NP   diclofenac (VOLTAREN) 75 MG EC tablet Take 1 tablet (75 mg total) by mouth 2 (two) times daily for 7 days. 14 tablet Imogene Mana, NP      PDMP not reviewed this encounter.   Imogene Mana, NP 06/01/23 1408

## 2023-06-01 NOTE — ED Triage Notes (Signed)
 Left facial swelling for 8 weeks. Went to dentist and had cracked tooth. Had root canal that didn't help, so ended up getting tooth pulled. Reports that still has swelling. And had xray done 2 weeks ago but never heard back about it. Took amoxicillin  then was given something else. Doing heat and ice

## 2023-06-01 NOTE — Discharge Instructions (Addendum)
 Take your antibiotic 3 times a day and the pain medicine twice a day (alternate with tylenol in the afternoon).  Follow-up with your primary care provider regarding your x-ray results and follow-up with your oral surgeon if you do not start noticing improvement over the next couple of days of being on the antibiotic.  You may continue using heat and ice as needed.  You may also continue using the chlorhexidine swish from your oral surgeon.

## 2023-07-04 ENCOUNTER — Other Ambulatory Visit: Payer: Self-pay | Admitting: Family Medicine

## 2023-07-04 DIAGNOSIS — M4712 Other spondylosis with myelopathy, cervical region: Secondary | ICD-10-CM

## 2023-07-08 ENCOUNTER — Ambulatory Visit
Admission: RE | Admit: 2023-07-08 | Discharge: 2023-07-08 | Disposition: A | Discharge status: Home / Self Care | Source: Ambulatory Visit | Attending: Family Medicine | Admitting: Family Medicine

## 2023-07-08 DIAGNOSIS — M4712 Other spondylosis with myelopathy, cervical region: Secondary | ICD-10-CM

## 2023-08-08 ENCOUNTER — Encounter: Payer: Self-pay | Admitting: Advanced Practice Midwife
